# Patient Record
Sex: Male | Born: 1950 | Race: White | Hispanic: No | Marital: Married | Smoking: Never smoker
Health system: Southern US, Community
[De-identification: ages and names within clinical notes are randomized; demographics above are authoritative.]

## PROBLEM LIST (undated history)

## (undated) DIAGNOSIS — C61 Malignant neoplasm of prostate: Secondary | ICD-10-CM

## (undated) DIAGNOSIS — E669 Obesity, unspecified: Secondary | ICD-10-CM

## (undated) DIAGNOSIS — E559 Vitamin D deficiency, unspecified: Secondary | ICD-10-CM

## (undated) DIAGNOSIS — I82402 Acute embolism and thrombosis of unspecified deep veins of left lower extremity: Secondary | ICD-10-CM

## (undated) DIAGNOSIS — R7303 Prediabetes: Secondary | ICD-10-CM

## (undated) DIAGNOSIS — G4733 Obstructive sleep apnea (adult) (pediatric): Secondary | ICD-10-CM

## (undated) DIAGNOSIS — H9190 Unspecified hearing loss, unspecified ear: Secondary | ICD-10-CM

## (undated) DIAGNOSIS — M1990 Unspecified osteoarthritis, unspecified site: Secondary | ICD-10-CM

## (undated) DIAGNOSIS — D6859 Other primary thrombophilia: Secondary | ICD-10-CM

## (undated) DIAGNOSIS — J45909 Unspecified asthma, uncomplicated: Secondary | ICD-10-CM

## (undated) DIAGNOSIS — I10 Essential (primary) hypertension: Secondary | ICD-10-CM

## (undated) HISTORY — PX: VASECTOMY: SHX75

## (undated) HISTORY — PX: HERNIA REPAIR: SHX51

## (undated) HISTORY — PX: APPENDECTOMY: SHX54

## (undated) HISTORY — PX: CHOLECYSTECTOMY: SHX55

## (undated) HISTORY — PX: TONSILLECTOMY: SUR1361

## (undated) HISTORY — PX: OTHER SURGICAL HISTORY: SHX169

## (undated) HISTORY — PX: VEIN LIGATION AND STRIPPING: SHX2653

---
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
## 1972-01-24 DIAGNOSIS — I82402 Acute embolism and thrombosis of unspecified deep veins of left lower extremity: Secondary | ICD-10-CM

## 1972-01-24 HISTORY — DX: Acute embolism and thrombosis of unspecified deep veins of left lower extremity: I82.402

## 1999-11-20 ENCOUNTER — Emergency Department (HOSPITAL_COMMUNITY): Admission: EM | Admit: 1999-11-20 | Discharge: 1999-11-20 | Payer: Self-pay | Admitting: Emergency Medicine

## 1999-11-20 ENCOUNTER — Encounter: Payer: Self-pay | Admitting: Emergency Medicine

## 1999-11-29 ENCOUNTER — Encounter: Payer: Self-pay | Admitting: Specialist

## 1999-11-29 ENCOUNTER — Encounter: Admission: RE | Admit: 1999-11-29 | Discharge: 1999-11-29 | Payer: Self-pay | Admitting: Specialist

## 2019-12-24 ENCOUNTER — Encounter (HOSPITAL_COMMUNITY): Payer: Self-pay

## 2019-12-24 ENCOUNTER — Other Ambulatory Visit: Payer: Self-pay | Admitting: Orthopedic Surgery

## 2019-12-24 NOTE — Patient Instructions (Addendum)
DUE TO COVID-19 ONLY ONE VISITOR IS ALLOWED TO COME WITH YOU AND STAY IN THE WAITING ROOM ONLY DURING PRE OP AND PROCEDURE.   IF YOU WILL BE ADMITTED INTO THE HOSPITAL YOU ARE ALLOWED ONE SUPPORT PERSON DURING VISITATION HOURS ONLY (10AM -8PM)   . The support person may change daily. . The support person must pass our screening, gel in and out, and wear a mask at all times, including in the patient's room. . Patients must also wear a mask when staff or their support person are in the room.   COVID SWAB TESTING MUST BE COMPLETED ON: Today, Dec 25, 2019 at 12:45 PM     [address] [place], [place] [address]  (Must self quarantine after testing. Follow instructions on handout.)       Your procedure is scheduled on: Monday, Dec. 6, 2021   Report to [facility] Main  Entrance   Report to Short Stay at 5:30 AM   [facility])   Call this number if you have problems the morning of surgery [phone]   Do not eat food :After Midnight.   May have liquids until 4:30AM   day of surgery  CLEAR LIQUID DIET  Foods Allowed                                                                     Foods Excluded  Water, Black Coffee and tea, regular and decaf                             liquids that you cannot  Plain Jell-O in any flavor  (No red)                                           see through such as: Fruit ices (not with fruit pulp)                                     milk, soups, orange juice              Iced Popsicles (No red)                                    All solid food                                   Apple juices Sports drinks like Gatorade (No red) Lightly seasoned clear broth or consume(fat free) Sugar, honey syrup  Sample Menu Breakfast                                Lunch  Supper Cranberry juice                    Beef broth                            Chicken broth Jell-O                                     Grape juice                            Apple juice Coffee or tea                        Jell-O                                      Popsicle                                                Coffee or tea                        Coffee or tea      Complete one G2 drink the morning of surgery at  5:00AM     the day of surgery.   Oral Hygiene is also important to reduce your risk of infection.                                    Remember - BRUSH YOUR TEETH THE MORNING OF SURGERY WITH YOUR REGULAR TOOTHPASTE   Do NOT smoke after Midnight   Take these medicines the morning of surgery with A SIP OF WATER: Amlodipine  DO NOT TAKE ANY ORAL DIABETIC MEDICATIONS DAY OF YOUR SURGERY                               You may not have any metal on your body including  jewelry, and body piercings             Do not wear lotions, powders, perfumes/cologne, or deodorant                          Men may shave face and neck.   Do not bring valuables to the hospital. [place].   Contacts, dentures or bridgework may not be worn into surgery.   Bring small overnight bag day of surgery.    Special Instructions: Bring a copy of your healthcare power of attorney and living will documents         the day of surgery if you haven't scanned them in before.              Please read over the following fact sheets you were given: [facility] [place] [phone]   - Preparing for Surgery Before surgery, you can play an important role.  Because skin is not sterile, your skin needs to be as free of germs as possible.  You can reduce the number of germs on your skin by washing with CHG (chlorahexidine gluconate) soap before surgery.  CHG is an antiseptic cleaner which kills germs and bonds with the skin to continue killing germs even after washing. Please DO NOT use if you have an allergy to CHG or antibacterial soaps.  If your skin  becomes reddened/irritated stop using the CHG and inform your nurse when you arrive at Short Stay. Do not shave (including legs and underarms) for at least 48 hours prior to the first CHG shower.  You may shave your face/neck.  Please follow these instructions carefully:  1.  Shower with CHG Soap the night before surgery and the  morning of surgery.  2.  If you choose to wash your hair, wash your hair first as usual with your normal  shampoo.  3.  After you shampoo, rinse your hair and body thoroughly to remove the shampoo.                             4.  Use CHG as you would any other liquid soap.  You can apply chg directly to the skin and wash.  Gently with a scrungie or clean washcloth.  5.  Apply the CHG Soap to your body ONLY FROM THE NECK DOWN.   Do   not use on face/ open                           Wound or open sores. Avoid contact with eyes, ears mouth and   genitals (private parts).                       Wash face,  Genitals (private parts) with your normal soap.             6.  Wash thoroughly, paying special attention to the area where your    surgery  will be performed.  7.  Thoroughly rinse your body with warm water from the neck down.  8.  DO NOT shower/wash with your normal soap after using and rinsing off the CHG Soap.                9.  Pat yourself dry with a clean towel.            10.  Wear clean pajamas.            11.  Place clean sheets on your bed the night of your first shower and do not  sleep with pets. Day of Surgery : Do not apply any lotions/deodorants the morning of surgery.  Please wear clean clothes to the hospital/surgery center.  FAILURE TO FOLLOW THESE INSTRUCTIONS MAY RESULT IN THE CANCELLATION OF YOUR SURGERY  PATIENT SIGNATURE_________________________________  NURSE SIGNATURE__________________________________  ________________________________________________________________________   Adam Phenix  An incentive spirometer is a tool that  can help keep your lungs clear and active. This tool measures how well you are filling your lungs with each breath. Taking long deep breaths may help reverse or decrease the chance of developing breathing (pulmonary) problems (especially infection) following:  A long period of time when you are unable to move or be active. BEFORE THE PROCEDURE  If the spirometer includes an indicator to show your best effort, your nurse or respiratory therapist will set it to a desired goal.  If possible, sit up straight or lean slightly forward. Try not to slouch.  Hold the incentive spirometer in an upright position. INSTRUCTIONS FOR USE  1. Sit on the edge of your bed if possible, or sit up as far as you can in bed or on a chair. 2. Hold the incentive spirometer in an upright position. 3. Breathe out normally. 4. Place the mouthpiece in your mouth and seal your lips tightly around it. 5. Breathe in slowly and as deeply as possible, raising the piston or the ball toward the top of the column. 6. Hold your breath for 3-5 seconds or for as long as possible. Allow the piston or ball to fall to the bottom of the column. 7. Remove the mouthpiece from your mouth and breathe out normally. 8. Rest for a few seconds and repeat Steps 1 through 7 at least 10 times every 1-2 hours when you are awake. Take your time and take a few normal breaths between deep breaths. 9. The spirometer may include an indicator to show your best effort. Use the indicator as a goal to work toward during each repetition. 10. After each set of 10 deep breaths, practice coughing to be sure your lungs are clear. If you have an incision (the cut made at the time of surgery), support your incision when coughing by placing a pillow or rolled up towels firmly against it. Once you are able to get out of bed, walk around indoors and cough well. You may stop using the incentive spirometer when instructed by your caregiver.  RISKS AND  COMPLICATIONS  Take your time so you do not get dizzy or light-headed.  If you are in pain, you may need to take or ask for pain medication before doing incentive spirometry. It is harder to take a deep breath if you are having pain. AFTER USE  Rest and breathe slowly and easily.  It can be helpful to keep track of a log of your progress. Your caregiver can provide you with a simple table to help with this. If you are using the spirometer at home, follow these instructions: [place] IF:   You are having difficultly using the spirometer.  You have trouble using the spirometer as often as instructed.  Your pain medication is not giving enough relief while using the spirometer.  You develop fever of 100.5 F (38.1 C) or higher. SEEK IMMEDIATE MEDICAL CARE IF:   You cough up bloody sputum that had not been present before.  You develop fever of 102 F (38.9 C) or greater.  You develop worsening pain at or near the incision site. MAKE SURE YOU:   Understand these instructions.  Will watch your condition.  Will get help right away if you are not doing well or get worse. Document Released: 05/22/2006 Document Revised: 04/03/2011 Document Reviewed: 07/23/2006 [facility] Patient Information 2014 [place], [place].   ________________________________________________________________________

## 2019-12-24 NOTE — Progress Notes (Addendum)
COVID Vaccine Completed: N/A Date COVID Vaccine completed:N/A COVID vaccine manufacturer: N/A  PCP - Dr. Bernerd Limbo last office visit 12/16/19 Cardiologist - N/A  Chest x-ray - N/A EKG - 12/16/19 received and placed in chart Stress Test - N/A ECHO - N/A Cardiac Cath - N/A Pacemaker/ICD device last checked:N/A  Sleep Study - Yes CPAP - does not use sleeps in a recliner  Fasting Blood Sugar - N/A Checks Blood Sugar __N/A___ times a day Hgb A1c (6.7) 12/16/19 in care everywhere  Blood Thinner Instructions: Warfarin last dose 12/24/2019 Aspirin Instructions: Last Dose:  Activity level:  Unable to go up a flight of stairs without symptoms   Can go up a flight of stairs without stopping and without symptoms   Able to exercise without symptoms     Anesthesia review:  On Warfarin  Patient denies shortness of breath, fever, cough and chest pain at PAT appointment   Patient verbalized understanding of instructions that were given to them at the PAT appointment. Patient was also instructed that they will need to review over the PAT instructions again at home before surgery.

## 2019-12-25 ENCOUNTER — Other Ambulatory Visit (HOSPITAL_COMMUNITY)
Admission: RE | Admit: 2019-12-25 | Discharge: 2019-12-25 | Disposition: A | Discharge status: Home / Self Care | Payer: Medicare Other | Source: Ambulatory Visit | Attending: Orthopedic Surgery | Admitting: Orthopedic Surgery

## 2019-12-25 ENCOUNTER — Other Ambulatory Visit: Payer: Self-pay

## 2019-12-25 ENCOUNTER — Encounter (HOSPITAL_COMMUNITY)
Admission: RE | Admit: 2019-12-25 | Discharge: 2019-12-25 | Disposition: A | Discharge status: Home / Self Care | Payer: Medicare Other | Source: Ambulatory Visit | Attending: Orthopedic Surgery | Admitting: Orthopedic Surgery

## 2019-12-25 ENCOUNTER — Encounter (HOSPITAL_COMMUNITY): Payer: Self-pay

## 2019-12-25 DIAGNOSIS — Z01812 Encounter for preprocedural laboratory examination: Secondary | ICD-10-CM | POA: Insufficient documentation

## 2019-12-25 DIAGNOSIS — Z20822 Contact with and (suspected) exposure to covid-19: Secondary | ICD-10-CM | POA: Diagnosis not present

## 2019-12-25 HISTORY — DX: Vitamin D deficiency, unspecified: E55.9

## 2019-12-25 HISTORY — DX: Malignant neoplasm of prostate: C61

## 2019-12-25 HISTORY — DX: Obstructive sleep apnea (adult) (pediatric): G47.33

## 2019-12-25 HISTORY — DX: Obesity, unspecified: E66.9

## 2019-12-25 HISTORY — DX: Other primary thrombophilia: D68.59

## 2019-12-25 HISTORY — DX: Prediabetes: R73.03

## 2019-12-25 HISTORY — DX: Essential (primary) hypertension: I10

## 2019-12-25 HISTORY — DX: Unspecified hearing loss, unspecified ear: H91.90

## 2019-12-25 HISTORY — DX: Unspecified osteoarthritis, unspecified site: M19.90

## 2019-12-25 HISTORY — DX: Unspecified asthma, uncomplicated: J45.909

## 2019-12-25 LAB — SARS CORONAVIRUS 2 (TAT 6-24 HRS): SARS Coronavirus 2: NEGATIVE

## 2019-12-25 LAB — SURGICAL PCR SCREEN
MRSA, PCR: NEGATIVE
Staphylococcus aureus: NEGATIVE

## 2019-12-28 MED ORDER — BUPIVACAINE LIPOSOME 1.3 % IJ SUSP
20.0000 mL | Freq: Once | INTRAMUSCULAR | Status: DC
  Filled 2019-12-28: qty 20

## 2019-12-28 NOTE — Anesthesia Preprocedure Evaluation (Addendum)
Anesthesia Evaluation  Patient identified by MRN, date of birth, ID band Patient awake    Reviewed: Allergy & Precautions, NPO status , Patient's Chart, lab work & pertinent test results  Airway Mallampati: II  TM Distance: >3 FB Neck ROM: Full    Dental   Pulmonary asthma , sleep apnea ,    breath sounds clear to auscultation       Cardiovascular hypertension, Pt. 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 last dose 12/1), ,   Anesthesia Other Findings   Reproductive/Obstetrics                            Lab Results  Component Value Date   WBC 5.3 12/29/2019   HGB 13.9 12/29/2019   HCT 42.1 12/29/2019   MCV 91.1 12/29/2019   PLT 224 12/29/2019   Lab Results  Component Value Date   CREATININE 1.13 12/29/2019   BUN 22 12/29/2019   NA 136 12/29/2019   K 3.2 (L) 12/29/2019   CL 101 12/29/2019   CO2 23 12/29/2019    Anesthesia Physical Anesthesia Plan  ASA: III  Anesthesia Plan: Spinal   Post-op Pain Management:  Regional for Post-op pain   Induction:   PONV Risk Score and Plan: 1 and Propofol infusion, Ondansetron and Treatment may vary due to age or medical condition  Airway Management Planned: Natural Airway and Simple Face Mask  Additional Equipment:   Intra-op Plan:   Post-operative Plan:   Informed Consent: I have reviewed the patients History and Physical, chart, labs and discussed the procedure including the risks, benefits and alternatives for the proposed anesthesia with the patient or authorized representative who has indicated his/her understanding and acceptance.        Plan Discussed with: CRNA  Anesthesia Plan Comments:        Anesthesia Quick Evaluation

## 2019-12-29 ENCOUNTER — Other Ambulatory Visit: Payer: Self-pay

## 2019-12-29 ENCOUNTER — Ambulatory Visit (HOSPITAL_COMMUNITY): Payer: Medicare Other | Admitting: Physician Assistant

## 2019-12-29 ENCOUNTER — Observation Stay (HOSPITAL_COMMUNITY)
Admission: RE | Admit: 2019-12-29 | Discharge: 2019-12-30 | Disposition: A | Discharge status: Home / Self Care | Payer: Medicare Other | Attending: Orthopedic Surgery | Admitting: Orthopedic Surgery

## 2019-12-29 ENCOUNTER — Encounter (HOSPITAL_COMMUNITY)
Admission: RE | Disposition: A | Discharge status: Home / Self Care | Payer: Self-pay | Source: Home / Self Care | Attending: Orthopedic Surgery

## 2019-12-29 ENCOUNTER — Encounter (HOSPITAL_COMMUNITY): Payer: Self-pay | Admitting: Orthopedic Surgery

## 2019-12-29 DIAGNOSIS — Z96659 Presence of unspecified artificial knee joint: Secondary | ICD-10-CM

## 2019-12-29 DIAGNOSIS — J45909 Unspecified asthma, uncomplicated: Secondary | ICD-10-CM | POA: Insufficient documentation

## 2019-12-29 DIAGNOSIS — Z79899 Other long term (current) drug therapy: Secondary | ICD-10-CM | POA: Insufficient documentation

## 2019-12-29 DIAGNOSIS — M25562 Pain in left knee: Secondary | ICD-10-CM | POA: Diagnosis present

## 2019-12-29 DIAGNOSIS — M1711 Unilateral primary osteoarthritis, right knee: Principal | ICD-10-CM | POA: Insufficient documentation

## 2019-12-29 DIAGNOSIS — Z7901 Long term (current) use of anticoagulants: Secondary | ICD-10-CM | POA: Insufficient documentation

## 2019-12-29 DIAGNOSIS — Z8546 Personal history of malignant neoplasm of prostate: Secondary | ICD-10-CM | POA: Insufficient documentation

## 2019-12-29 DIAGNOSIS — I10 Essential (primary) hypertension: Secondary | ICD-10-CM | POA: Diagnosis not present

## 2019-12-29 HISTORY — PX: TOTAL KNEE ARTHROPLASTY: SHX125

## 2019-12-29 LAB — CBC WITH DIFFERENTIAL/PLATELET
Abs Immature Granulocytes: 0.05 10*3/uL (ref 0.00–0.07)
Basophils Absolute: 0 10*3/uL (ref 0.0–0.1)
Basophils Relative: 1 %
Eosinophils Absolute: 0.2 10*3/uL (ref 0.0–0.5)
Eosinophils Relative: 3 %
HCT: 42.1 % (ref 39.0–52.0)
Hemoglobin: 13.9 g/dL (ref 13.0–17.0)
Immature Granulocytes: 1 %
Lymphocytes Relative: 20 %
Lymphs Abs: 1 10*3/uL (ref 0.7–4.0)
MCH: 30.1 pg (ref 26.0–34.0)
MCHC: 33 g/dL (ref 30.0–36.0)
MCV: 91.1 fL (ref 80.0–100.0)
Monocytes Absolute: 0.7 10*3/uL (ref 0.1–1.0)
Monocytes Relative: 13 %
Neutro Abs: 3.3 10*3/uL (ref 1.7–7.7)
Neutrophils Relative %: 62 %
Platelets: 224 10*3/uL (ref 150–400)
RBC: 4.62 MIL/uL (ref 4.22–5.81)
RDW: 13.4 % (ref 11.5–15.5)
WBC: 5.3 10*3/uL (ref 4.0–10.5)
nRBC: 0 % (ref 0.0–0.2)

## 2019-12-29 LAB — COMPREHENSIVE METABOLIC PANEL
ALT: 44 U/L (ref 0–44)
AST: 29 U/L (ref 15–41)
Albumin: 4.2 g/dL (ref 3.5–5.0)
Alkaline Phosphatase: 47 U/L (ref 38–126)
Anion gap: 12 (ref 5–15)
BUN: 22 mg/dL (ref 8–23)
CO2: 23 mmol/L (ref 22–32)
Calcium: 9.2 mg/dL (ref 8.9–10.3)
Chloride: 101 mmol/L (ref 98–111)
Creatinine, Ser: 1.13 mg/dL (ref 0.61–1.24)
GFR, Estimated: >60 mL/min (ref >60)
Glucose, Bld: 130 mg/dL — ABNORMAL HIGH (ref 70–99)
Potassium: 3.2 mmol/L — ABNORMAL LOW (ref 3.5–5.1)
Sodium: 136 mmol/L (ref 135–145)
Total Bilirubin: 0.6 mg/dL (ref 0.3–1.2)
Total Protein: 7.2 g/dL (ref 6.5–8.1)

## 2019-12-29 LAB — PROTIME-INR
INR: 1.2 (ref 0.8–1.2)
Prothrombin Time: 14.9 seconds (ref 11.4–15.2)

## 2019-12-29 SURGERY — ARTHROPLASTY, KNEE, TOTAL
Anesthesia: Spinal | Site: Knee | Laterality: Left

## 2019-12-29 MED ORDER — MENTHOL 3 MG MT LOZG: 1.0000 | LOZENGE | OROMUCOSAL | Status: DC | PRN

## 2019-12-29 MED ORDER — AMISULPRIDE (ANTIEMETIC) 5 MG/2ML IV SOLN: 10.0000 mg | Freq: Once | INTRAVENOUS | Status: DC | PRN

## 2019-12-29 MED ORDER — MIDAZOLAM HCL 2 MG/2ML IJ SOLN
1.0000 mg | INTRAMUSCULAR | Status: DC
  Administered 2019-12-29: 1 mg via INTRAVENOUS
  Filled 2019-12-29: qty 2

## 2019-12-29 MED ORDER — ZOLPIDEM TARTRATE 5 MG PO TABS: 5.0000 mg | ORAL_TABLET | Freq: Every evening | ORAL | Status: DC | PRN

## 2019-12-29 MED ORDER — METHOCARBAMOL 500 MG IVPB - SIMPLE MED
500.0000 mg | Freq: Four times a day (QID) | INTRAVENOUS | Status: DC | PRN
  Filled 2019-12-29: qty 50

## 2019-12-29 MED ORDER — PHENYLEPHRINE HCL (PRESSORS) 10 MG/ML IV SOLN
INTRAVENOUS | Status: AC
  Filled 2019-12-29: qty 1

## 2019-12-29 MED ORDER — DEXAMETHASONE SODIUM PHOSPHATE 10 MG/ML IJ SOLN
8.0000 mg | Freq: Once | INTRAMUSCULAR | Status: AC
  Administered 2019-12-29: 8 mg via INTRAVENOUS

## 2019-12-29 MED ORDER — WATER FOR IRRIGATION, STERILE IR SOLN
Status: DC | PRN
  Administered 2019-12-29: 2000 mL

## 2019-12-29 MED ORDER — MIDAZOLAM HCL 2 MG/2ML IJ SOLN
INTRAMUSCULAR | Status: DC | PRN
  Administered 2019-12-29: 1 mg via INTRAVENOUS
  Administered 2019-12-29: .5 mg via INTRAVENOUS

## 2019-12-29 MED ORDER — CEFAZOLIN SODIUM-DEXTROSE 2-4 GM/100ML-% IV SOLN
2.0000 g | INTRAVENOUS | Status: AC
  Administered 2019-12-29: 2 g via INTRAVENOUS
  Filled 2019-12-29: qty 100

## 2019-12-29 MED ORDER — METOCLOPRAMIDE HCL 5 MG PO TABS: 5.0000 mg | ORAL_TABLET | Freq: Three times a day (TID) | ORAL | Status: DC | PRN

## 2019-12-29 MED ORDER — 0.9 % SODIUM CHLORIDE (POUR BTL) OPTIME
TOPICAL | Status: DC | PRN
  Administered 2019-12-29: 1000 mL

## 2019-12-29 MED ORDER — EPHEDRINE SULFATE-NACL 50-0.9 MG/10ML-% IV SOSY
PREFILLED_SYRINGE | INTRAVENOUS | Status: DC | PRN
  Administered 2019-12-29 (×4): 5 mg via INTRAVENOUS

## 2019-12-29 MED ORDER — ONDANSETRON HCL 4 MG/2ML IJ SOLN
INTRAMUSCULAR | Status: AC
  Filled 2019-12-29: qty 2

## 2019-12-29 MED ORDER — SODIUM CHLORIDE 0.9 % IR SOLN
Status: DC | PRN
  Administered 2019-12-29: 1000 mL

## 2019-12-29 MED ORDER — METOCLOPRAMIDE HCL 5 MG/ML IJ SOLN: 5.0000 mg | Freq: Three times a day (TID) | INTRAMUSCULAR | Status: DC | PRN

## 2019-12-29 MED ORDER — ACETAMINOPHEN 500 MG PO TABS
1000.0000 mg | ORAL_TABLET | Freq: Four times a day (QID) | ORAL | Status: AC
  Administered 2019-12-29 – 2019-12-30 (×4): 1000 mg via ORAL
  Filled 2019-12-29 (×4): qty 2

## 2019-12-29 MED ORDER — PROPOFOL 1000 MG/100ML IV EMUL
INTRAVENOUS | Status: AC
  Filled 2019-12-29: qty 100

## 2019-12-29 MED ORDER — ALUM & MAG HYDROXIDE-SIMETH 200-200-20 MG/5ML PO SUSP: 30.0000 mL | ORAL | Status: DC | PRN

## 2019-12-29 MED ORDER — DEXAMETHASONE SODIUM PHOSPHATE 10 MG/ML IJ SOLN
INTRAMUSCULAR | Status: AC
  Filled 2019-12-29: qty 1

## 2019-12-29 MED ORDER — SODIUM CHLORIDE 0.9 % IV SOLN: INTRAVENOUS | Status: DC

## 2019-12-29 MED ORDER — LISINOPRIL 20 MG PO TABS
20.0000 mg | ORAL_TABLET | Freq: Every day | ORAL | Status: DC
  Administered 2019-12-30: 20 mg via ORAL
  Filled 2019-12-29: qty 1

## 2019-12-29 MED ORDER — BUPIVACAINE IN DEXTROSE 0.75-8.25 % IT SOLN
INTRATHECAL | Status: DC | PRN
  Administered 2019-12-29: 1.5 mL via INTRATHECAL

## 2019-12-29 MED ORDER — LISINOPRIL-HYDROCHLOROTHIAZIDE 20-25 MG PO TABS: 1.0000 | ORAL_TABLET | ORAL | Status: DC

## 2019-12-29 MED ORDER — BUPIVACAINE-EPINEPHRINE 0.25% -1:200000 IJ SOLN
INTRAMUSCULAR | Status: DC | PRN
  Administered 2019-12-29: 30 mL

## 2019-12-29 MED ORDER — HYDROCHLOROTHIAZIDE 25 MG PO TABS
25.0000 mg | ORAL_TABLET | Freq: Every day | ORAL | Status: DC
  Administered 2019-12-30: 25 mg via ORAL
  Filled 2019-12-29: qty 1

## 2019-12-29 MED ORDER — SENNOSIDES-DOCUSATE SODIUM 8.6-50 MG PO TABS: 1.0000 | ORAL_TABLET | Freq: Every evening | ORAL | Status: DC | PRN

## 2019-12-29 MED ORDER — FENTANYL CITRATE (PF) 100 MCG/2ML IJ SOLN
50.0000 ug | INTRAMUSCULAR | Status: DC
  Administered 2019-12-29: 50 ug via INTRAVENOUS
  Filled 2019-12-29: qty 2

## 2019-12-29 MED ORDER — GABAPENTIN 300 MG PO CAPS
300.0000 mg | ORAL_CAPSULE | Freq: Once | ORAL | Status: AC
  Administered 2019-12-29: 300 mg via ORAL
  Filled 2019-12-29: qty 1

## 2019-12-29 MED ORDER — ONDANSETRON HCL 4 MG/2ML IJ SOLN
INTRAMUSCULAR | Status: DC | PRN
  Administered 2019-12-29: 4 mg via INTRAVENOUS

## 2019-12-29 MED ORDER — LACTATED RINGERS IV SOLN: INTRAVENOUS | Status: DC

## 2019-12-29 MED ORDER — FENTANYL CITRATE (PF) 100 MCG/2ML IJ SOLN
25.0000 ug | INTRAMUSCULAR | Status: DC | PRN
  Administered 2019-12-29 (×3): 50 ug via INTRAVENOUS

## 2019-12-29 MED ORDER — AMLODIPINE BESYLATE 10 MG PO TABS
10.0000 mg | ORAL_TABLET | Freq: Every day | ORAL | Status: DC
  Administered 2019-12-30: 10 mg via ORAL
  Filled 2019-12-29: qty 1

## 2019-12-29 MED ORDER — WARFARIN - PHARMACIST DOSING INPATIENT: Freq: Every day | Status: DC

## 2019-12-29 MED ORDER — ONDANSETRON HCL 4 MG PO TABS: 4.0000 mg | ORAL_TABLET | Freq: Four times a day (QID) | ORAL | Status: DC | PRN

## 2019-12-29 MED ORDER — CHLORHEXIDINE GLUCONATE 0.12 % MT SOLN
15.0000 mL | Freq: Once | OROMUCOSAL | Status: AC
  Administered 2019-12-29: 15 mL via OROMUCOSAL

## 2019-12-29 MED ORDER — TRANEXAMIC ACID-NACL 1000-0.7 MG/100ML-% IV SOLN
1000.0000 mg | INTRAVENOUS | Status: AC
  Administered 2019-12-29: 1000 mg via INTRAVENOUS
  Filled 2019-12-29: qty 100

## 2019-12-29 MED ORDER — POVIDONE-IODINE 10 % EX SWAB
2.0000 "application " | Freq: Once | CUTANEOUS | Status: AC
  Administered 2019-12-29: 2 via TOPICAL

## 2019-12-29 MED ORDER — FERROUS SULFATE 325 (65 FE) MG PO TABS
325.0000 mg | ORAL_TABLET | Freq: Three times a day (TID) | ORAL | Status: DC
  Administered 2019-12-30 (×2): 325 mg via ORAL
  Filled 2019-12-29 (×2): qty 1

## 2019-12-29 MED ORDER — ROPIVACAINE HCL 5 MG/ML IJ SOLN
INTRAMUSCULAR | Status: DC | PRN
  Administered 2019-12-29: 25 mL via PERINEURAL

## 2019-12-29 MED ORDER — PANTOPRAZOLE SODIUM 40 MG PO TBEC
40.0000 mg | DELAYED_RELEASE_TABLET | Freq: Every day | ORAL | Status: DC
  Administered 2019-12-30: 40 mg via ORAL
  Filled 2019-12-29: qty 1

## 2019-12-29 MED ORDER — FLEET ENEMA 7-19 GM/118ML RE ENEM: 1.0000 | ENEMA | Freq: Once | RECTAL | Status: DC | PRN

## 2019-12-29 MED ORDER — DIPHENHYDRAMINE HCL 12.5 MG/5ML PO ELIX: 12.5000 mg | ORAL_SOLUTION | ORAL | Status: DC | PRN

## 2019-12-29 MED ORDER — MIDAZOLAM HCL 2 MG/2ML IJ SOLN
INTRAMUSCULAR | Status: AC
  Filled 2019-12-29: qty 2

## 2019-12-29 MED ORDER — WARFARIN SODIUM 5 MG PO TABS
7.5000 mg | ORAL_TABLET | Freq: Once | ORAL | Status: AC
  Administered 2019-12-29: 7.5 mg via ORAL
  Filled 2019-12-29: qty 1

## 2019-12-29 MED ORDER — PROPOFOL 500 MG/50ML IV EMUL
INTRAVENOUS | Status: DC | PRN
  Administered 2019-12-29: 50 ug/kg/min via INTRAVENOUS

## 2019-12-29 MED ORDER — DEXAMETHASONE SODIUM PHOSPHATE 10 MG/ML IJ SOLN
10.0000 mg | Freq: Once | INTRAMUSCULAR | Status: AC
  Administered 2019-12-30: 10 mg via INTRAVENOUS
  Filled 2019-12-29: qty 1

## 2019-12-29 MED ORDER — BISACODYL 5 MG PO TBEC: 5.0000 mg | DELAYED_RELEASE_TABLET | Freq: Every day | ORAL | Status: DC | PRN

## 2019-12-29 MED ORDER — SODIUM CHLORIDE (PF) 0.9 % IJ SOLN
INTRAMUSCULAR | Status: AC
  Filled 2019-12-29: qty 20

## 2019-12-29 MED ORDER — DOCUSATE SODIUM 100 MG PO CAPS
100.0000 mg | ORAL_CAPSULE | Freq: Two times a day (BID) | ORAL | Status: DC
  Administered 2019-12-29 – 2019-12-30 (×2): 100 mg via ORAL
  Filled 2019-12-29 (×2): qty 1

## 2019-12-29 MED ORDER — HYDROMORPHONE HCL 1 MG/ML IJ SOLN: 0.5000 mg | INTRAMUSCULAR | Status: DC | PRN

## 2019-12-29 MED ORDER — FENTANYL CITRATE (PF) 100 MCG/2ML IJ SOLN
INTRAMUSCULAR | Status: AC
  Filled 2019-12-29: qty 2

## 2019-12-29 MED ORDER — TRANEXAMIC ACID-NACL 1000-0.7 MG/100ML-% IV SOLN
1000.0000 mg | Freq: Once | INTRAVENOUS | Status: AC
  Administered 2019-12-29: 1000 mg via INTRAVENOUS
  Filled 2019-12-29: qty 100

## 2019-12-29 MED ORDER — GABAPENTIN 300 MG PO CAPS
300.0000 mg | ORAL_CAPSULE | Freq: Three times a day (TID) | ORAL | Status: DC
  Administered 2019-12-29 – 2019-12-30 (×3): 300 mg via ORAL
  Filled 2019-12-29 (×3): qty 1

## 2019-12-29 MED ORDER — ONDANSETRON HCL 4 MG/2ML IJ SOLN: 4.0000 mg | Freq: Four times a day (QID) | INTRAMUSCULAR | Status: DC | PRN

## 2019-12-29 MED ORDER — CLONIDINE HCL (ANALGESIA) 100 MCG/ML EP SOLN
EPIDURAL | Status: DC | PRN
  Administered 2019-12-29: 50 ug

## 2019-12-29 MED ORDER — METHOCARBAMOL 500 MG PO TABS
500.0000 mg | ORAL_TABLET | Freq: Four times a day (QID) | ORAL | Status: DC | PRN
  Administered 2019-12-30: 500 mg via ORAL
  Filled 2019-12-29: qty 1

## 2019-12-29 MED ORDER — PROPOFOL 10 MG/ML IV BOLUS
INTRAVENOUS | Status: DC | PRN
  Administered 2019-12-29 (×2): 10 mg via INTRAVENOUS

## 2019-12-29 MED ORDER — CELECOXIB 200 MG PO CAPS
400.0000 mg | ORAL_CAPSULE | Freq: Once | ORAL | Status: AC
  Administered 2019-12-29: 400 mg via ORAL
  Filled 2019-12-29: qty 2

## 2019-12-29 MED ORDER — ACETAMINOPHEN 500 MG PO TABS
1000.0000 mg | ORAL_TABLET | Freq: Once | ORAL | Status: AC
  Administered 2019-12-29: 1000 mg via ORAL
  Filled 2019-12-29: qty 2

## 2019-12-29 MED ORDER — BUPIVACAINE-EPINEPHRINE (PF) 0.25% -1:200000 IJ SOLN
INTRAMUSCULAR | Status: AC
  Filled 2019-12-29: qty 30

## 2019-12-29 MED ORDER — PHENOL 1.4 % MT LIQD: 1.0000 | OROMUCOSAL | Status: DC | PRN

## 2019-12-29 MED ORDER — OXYCODONE HCL 5 MG PO TABS
5.0000 mg | ORAL_TABLET | ORAL | Status: DC | PRN
  Administered 2019-12-30 (×2): 5 mg via ORAL
  Filled 2019-12-29 (×2): qty 1

## 2019-12-29 MED ORDER — SODIUM CHLORIDE 0.9% FLUSH
INTRAVENOUS | Status: DC | PRN
  Administered 2019-12-29: 20 mL via INTRADERMAL

## 2019-12-29 MED ORDER — BUPIVACAINE LIPOSOME 1.3 % IJ SUSP
INTRAMUSCULAR | Status: DC | PRN
  Administered 2019-12-29: 20 mL

## 2019-12-29 MED ORDER — ORAL CARE MOUTH RINSE: 15.0000 mL | Freq: Once | OROMUCOSAL | Status: AC

## 2019-12-29 MED ORDER — PROPOFOL 10 MG/ML IV BOLUS
INTRAVENOUS | Status: AC
  Filled 2019-12-29: qty 40

## 2019-12-29 MED ORDER — CEFAZOLIN SODIUM-DEXTROSE 2-4 GM/100ML-% IV SOLN
2.0000 g | Freq: Four times a day (QID) | INTRAVENOUS | Status: AC
  Administered 2019-12-29 (×2): 2 g via INTRAVENOUS
  Filled 2019-12-29 (×2): qty 100

## 2019-12-29 MED ORDER — ENOXAPARIN SODIUM 40 MG/0.4ML ~~LOC~~ SOLN
40.0000 mg | SUBCUTANEOUS | Status: DC
  Administered 2019-12-30: 40 mg via SUBCUTANEOUS
  Filled 2019-12-29: qty 0.4

## 2019-12-29 SURGICAL SUPPLY — 58 items
ARTISURF 10M PLYL 10-11EF KNEE (Knees) ×1 IMPLANT
BAG SPEC THK2 15X12 ZIP CLS (MISCELLANEOUS) ×1
BAG ZIPLOCK 12X15 (MISCELLANEOUS) ×2 IMPLANT
BLADE SAGITTAL 13X1.27X60 (BLADE) ×2 IMPLANT
BLADE SAW SGTL 83.5X18.5 (BLADE) ×2 IMPLANT
BLADE SURG 15 STRL LF DISP TIS (BLADE) ×1 IMPLANT
BLADE SURG 15 STRL SS (BLADE) ×2
BLADE SURG SZ10 CARB STEEL (BLADE) ×4 IMPLANT
BNDG ELASTIC 6X5.8 VLCR STR LF (GAUZE/BANDAGES/DRESSINGS) ×2 IMPLANT
BOWL SMART MIX CTS (DISPOSABLE) ×2 IMPLANT
BSPLAT TIB 5D E CMNT STM LT (Knees) ×1 IMPLANT
CEMENT BONE SIMPLEX SPEEDSET (Cement) ×4 IMPLANT
COVER SURGICAL LIGHT HANDLE (MISCELLANEOUS) ×2 IMPLANT
COVER WAND RF STERILE (DRAPES) IMPLANT
CUFF TOURN SGL QUICK 34 (TOURNIQUET CUFF) ×2
CUFF TRNQT CYL 34X4.125X (TOURNIQUET CUFF) ×1 IMPLANT
DECANTER SPIKE VIAL GLASS SM (MISCELLANEOUS) ×4 IMPLANT
DRAPE INCISE IOBAN 66X45 STRL (DRAPES) ×4 IMPLANT
DRAPE U-SHAPE 47X51 STRL (DRAPES) ×2 IMPLANT
DRSG AQUACEL AG ADV 3.5X10 (GAUZE/BANDAGES/DRESSINGS) ×2 IMPLANT
DURAPREP 26ML APPLICATOR (WOUND CARE) ×4 IMPLANT
ELECT REM PT RETURN 15FT ADLT (MISCELLANEOUS) ×2 IMPLANT
FEMUR  CMT CCR STD SZ10 L KNEE (Knees) ×2 IMPLANT
FEMUR CMT CCR STD SZ10 L KNEE (Knees) ×1 IMPLANT
FEMUR CMTD CR PERS STD SZ 5 RT (Knees) IMPLANT
GLOVE BIOGEL M STRL SZ7.5 (GLOVE) ×2 IMPLANT
GLOVE BIOGEL PI IND STRL 7.5 (GLOVE) ×1 IMPLANT
GLOVE BIOGEL PI IND STRL 8.5 (GLOVE) ×2 IMPLANT
GLOVE BIOGEL PI INDICATOR 7.5 (GLOVE) ×1
GLOVE BIOGEL PI INDICATOR 8.5 (GLOVE) ×2
GLOVE SURG ORTHO 8.0 STRL STRW (GLOVE) ×6 IMPLANT
GOWN STRL REUS W/ TWL XL LVL3 (GOWN DISPOSABLE) ×2 IMPLANT
GOWN STRL REUS W/TWL XL LVL3 (GOWN DISPOSABLE) ×4
HANDPIECE INTERPULSE COAX TIP (DISPOSABLE) ×2
HOLDER FOLEY CATH W/STRAP (MISCELLANEOUS) ×2 IMPLANT
HOOD PEEL AWAY FLYTE STAYCOOL (MISCELLANEOUS) ×6 IMPLANT
KIT TURNOVER KIT A (KITS) IMPLANT
MANIFOLD NEPTUNE II (INSTRUMENTS) ×2 IMPLANT
NEEDLE HYPO 22GX1.5 SAFETY (NEEDLE) ×2 IMPLANT
NS IRRIG 1000ML POUR BTL (IV SOLUTION) ×2 IMPLANT
PACK TOTAL KNEE CUSTOM (KITS) ×2 IMPLANT
PENCIL SMOKE EVACUATOR (MISCELLANEOUS) ×2 IMPLANT
PROTECTOR NERVE ULNAR (MISCELLANEOUS) ×2 IMPLANT
SET HNDPC FAN SPRY TIP SCT (DISPOSABLE) ×1 IMPLANT
STEM POLY PAT PLY 32M KNEE (Knees) ×1 IMPLANT
STEM TIBIA 5 DEG SZ E L KNEE (Knees) IMPLANT
STRIP CLOSURE SKIN 1/2X4 (GAUZE/BANDAGES/DRESSINGS) ×3 IMPLANT
SUT BONE WAX W31G (SUTURE) ×2 IMPLANT
SUT MNCRL AB 3-0 PS2 18 (SUTURE) ×2 IMPLANT
SUT STRATAFIX 0 PDS 27 VIOLET (SUTURE) ×2
SUT STRATAFIX PDS+ 0 24IN (SUTURE) ×2 IMPLANT
SUT VIC AB 1 CT1 36 (SUTURE) ×2 IMPLANT
SUTURE STRATFX 0 PDS 27 VIOLET (SUTURE) ×1 IMPLANT
SYR 30ML LL (SYRINGE) ×4 IMPLANT
TIBIA STEM 5 DEG SZ E L KNEE (Knees) ×2 IMPLANT
TRAY FOLEY MTR SLVR 16FR STAT (SET/KITS/TRAYS/PACK) ×2 IMPLANT
WATER STERILE IRR 1000ML POUR (IV SOLUTION) ×4 IMPLANT
WRAP KNEE MAXI GEL POST OP (GAUZE/BANDAGES/DRESSINGS) ×2 IMPLANT

## 2019-12-29 NOTE — Progress Notes (Signed)
Assisted Dr. Rob Fitzgerald with left, ultrasound guided, adductor canal block. Side rails up, monitors on throughout procedure. See vital signs in flow sheet. Tolerated Procedure well.  

## 2019-12-29 NOTE — Progress Notes (Signed)
Orthopedic Tech Progress Note Patient Details:  GARAN FRAPPIER 10-10-1950 [id number]  CPM Left Knee CPM Left Knee: On Left Knee Flexion (Degrees): 90 Left Knee Extension (Degrees): 0  Post Interventions Patient Tolerated: Well Instructions Provided: Care of device Ortho Devices Type of Ortho Device: Bone foam zero knee Ortho Device/Splint Interventions: Ordered, Application, Adjustment   Post Interventions Patient Tolerated: Well Instructions Provided: Care of device   Braulio Bosch 12/29/2019, 10:48 AM

## 2019-12-29 NOTE — Op Note (Signed)
TOTAL KNEE REPLACEMENT OPERATIVE NOTE:  12/29/2019  9:13 AM  PATIENT:  Arthur King  69 y.o. male  PRE-OPERATIVE DIAGNOSIS:  Osteoarthritis of left knee M17.11  POST-OPERATIVE DIAGNOSIS:  Osteoarthritis of left knee M17.11  PROCEDURE:  Procedure(s): TOTAL KNEE ARTHROPLASTY  SURGEON:  Surgeon(s): Vickey Huger, MD  PHYSICIAN ASSISTANT: Nehemiah Massed, PA-C  ANESTHESIA:   spinal  SPECIMEN: None  COUNTS:  Correct  TOURNIQUET:  * Missing tourniquet times found for documented tourniquets in log: [id number] *  DICTATION:  Indication for procedure:    The patient is a 69 y.o. male who has failed conservative treatment for Osteoarthritis of left knee M17.11.  Informed consent was obtained prior to anesthesia. The risks versus benefits of the operation were explain and in a way the patient can, and did, understand.    Description of procedure:     The patient was taken to the operating room and placed under anesthesia.  The patient was positioned in the usual fashion taking care that all body parts were adequately padded and/or protected.  A tourniquet was applied and the leg prepped and draped in the usual sterile fashion.  The extremity was exsanguinated with the esmarch and tourniquet inflated to 300 mmHg.  Pre-operative range of motion was normal.   A midline incision approximately 6-7 inches long was made with a #10 blade.  A new blade was used to make a parapatellar arthrotomy going 2-3 cm into the quadriceps tendon, over the patella, and alongside the medial aspect of the patellar tendon.  A synovectomy was then performed with the #10 blade and forceps. I then elevated the deep MCL off the medial tibial metaphysis subperiosteally around to the semimembranosus attachment.    I everted the patella and used calipers to measure patellar thickness.  I used the reamer to ream down to appropriate thickness to recreate the native thickness.  I then removed excess bone with the rongeur and  sagittal saw.  I used the appropriately sized template and drilled the three lug holes.  I then put the trial in place and measured the thickness with the calipers to ensure recreation of the native thickness.  The trial was then removed and the patella subluxed and the knee brought into flexion.  A homan retractor was place to retract and protect the patella and lateral structures.  A Z-retractor was place medially to protect the medial structures.  The extra-medullary alignment system was used to make cut the tibial articular surface perpendicular to the anamotic axis of the tibia and in 3 degrees of posterior slope.  The cut surface and alignment jig was removed.  I then used the intramedullary alignment guide to make a valgus cut on the distal femur.  I then marked out the epicondylar axis on the distal femur. I then used the anterior referencing sizer and measured the femur to be a size 10.  The 4-In-1 cutting block was screwed into place in external rotation matching the posterior condylar angle, making our cuts perpendicular to the epicondylar axis.  Anterior, posterior and chamfer cuts were made with the sagittal saw.  The cutting block and cut pieces were removed.  A lamina spreader was placed in 90 degrees of flexion.  The ACL, PCL, menisci, and posterior condylar osteophytes were removed.  A 10 mm spacer blocked was found to offer good flexion and extension gap balance after minimal in degree releasing.   The scoop retractor was then placed and the femoral finishing block was pinned in  place.  The small sagittal saw was used as well as the lug drill to finish the femur.  The block and cut surfaces were removed and the medullary canal hole filled with autograft bone from the cut pieces.  The tibia was delivered forward in deep flexion and external rotation.  A size E tray was selected and pinned into place centered on the medial 1/3 of the tibial tubercle.  The reamer and keel was used to prepare  the tibia through the tray.    I then trialed with the size 10 femur, size E tibia, a 10 mm insert and the 32 patella.  I had excellent flexion/extension gap balance, excellent patella tracking.  Flexion was full and beyond 120 degrees; extension was zero.  These components were chosen and the staff opened them to me on the back table while the knee was lavaged copiously and the cement mixed.  The soft tissue was infiltrated with 60cc of exparel 1.3% through a 21 gauge needle.  I cemented in the components and removed all excess cement.  The polyethylene tibial component was snapped into place and the knee placed in extension while cement was hardening.  The capsule was infilltrated with a 60cc exparel/marcaine/saline mixture.   Once the cement was hard, the tourniquet was let down.  Hemostasis was obtained.  The arthrotomy was closed using a #1 stratofix running suture.  The deep soft tissues were closed with #0 vicryls and the subcuticular layer closed with #2-0 vicryl.  The skin was reapproximated and closed with 3.0 Monocryl.  The wound was covered with steristrips, aquacel dressing, and a TED stocking.   The patient was then awakened, extubated, and taken to the recovery room in stable condition.  BLOOD LOSS:  263ZC COMPLICATIONS:  None.  PLAN OF CARE: Admit for overnight observation  PATIENT DISPOSITION:  PACU - hemodynamically stable.    Please fax a copy of this op note to my office at [phone] (please only include page 1 and 2 of the Case Information op note)

## 2019-12-29 NOTE — Anesthesia Procedure Notes (Signed)
Spinal  Patient location during procedure: OR Start time: 12/29/2019 8:07 AM End time: 12/29/2019 8:12 AM Staffing Performed: anesthesiologist  Anesthesiologist: Suzette Battiest, MD Preanesthetic Checklist Completed: patient identified, IV checked, site marked, risks and benefits discussed, surgical consent, monitors and equipment checked, pre-op evaluation and timeout performed Spinal Block Patient position: sitting Prep: DuraPrep Patient monitoring: heart rate, cardiac monitor, continuous pulse ox and blood pressure Approach: midline Location: L4-5 Injection technique: single-shot Needle Needle type: Pencan  Needle gauge: 24 G Needle length: 9 cm Assessment Sensory level: T4

## 2019-12-29 NOTE — Anesthesia Procedure Notes (Signed)
Procedure Name: MAC Date/Time: 12/29/2019 8:08 AM Performed by: Eben Burow, CRNA Pre-anesthesia Checklist: Patient identified, Emergency Drugs available, Suction available, Patient being monitored and Timeout performed Oxygen Delivery Method: Simple face mask Placement Confirmation: positive ETCO2

## 2019-12-29 NOTE — Anesthesia Procedure Notes (Signed)
Anesthesia Regional Block: Adductor canal block   Pre-Anesthetic Checklist: ,, timeout performed, Correct Patient, Correct Site, Correct Laterality, Correct Procedure, Correct Position, site marked, Risks and benefits discussed,  Surgical consent,  Pre-op evaluation,  At surgeon's request and post-op pain management  Laterality: Left  Prep: chloraprep       Needles:  Injection technique: Single-shot  Needle Type: Echogenic Needle     Needle Length: 9cm  Needle Gauge: 21     Additional Needles:   Procedures:,,,, ultrasound used (permanent image in chart),,,,  Narrative:  Start time: 12/29/2019 7:36 AM End time: 12/29/2019 7:41 AM Injection made incrementally with aspirations every 5 mL.  Performed by: Personally  Anesthesiologist: Suzette Battiest, MD

## 2019-12-29 NOTE — H&P (Signed)
Arthur King MRN:  [id number] DOB/SEX:  1950/07/19/male  CHIEF COMPLAINT:  Painful left Knee  HISTORY: Patient is a 69 y.o. male presented with a history of pain in the left knee. Onset of symptoms was gradual starting a few years ago with gradually worsening course since that time. Patient has been treated conservatively with over-the-counter NSAIDs and activity modification. Patient currently rates pain in the knee at 10 out of 10 with activity. There is pain at night.  PAST MEDICAL HISTORY: There are no problems to display for this patient.  Past Medical History:  Diagnosis Date  . Arthritis   . Childhood asthma   . DVT, lower extremity, recurrent, left ([place]) 1974  . Hearing loss   . Hypertension   . Obese   . OSA on CPAP   . Pre-diabetes   . Prostate cancer ([place])   . Protein S deficiency ([place])   . Vitamin D deficiency    Past Surgical History:  Procedure Laterality Date  . APPENDECTOMY    . CHOLECYSTECTOMY    . HERNIA REPAIR     Abdominal  . toe nail removal Bilateral   . TONSILLECTOMY    . VEIN LIGATION AND STRIPPING Left      MEDICATIONS:   Medications Prior to Admission  Medication Sig Dispense Refill Last Dose  . AMLODIPINE BESYLATE PO Take 20 mg by mouth every morning.   12/29/2019 at 0500  . lisinopril-hydrochlorothiazide (ZESTORETIC) 20-25 MG tablet Take 1 tablet by mouth every morning.   12/28/2019 at Unknown time  . Multiple Vitamin (MULTIVITAMIN) capsule Take 1 capsule by mouth daily.   Past Week at Unknown time  . warfarin (COUMADIN) 6 MG tablet Take 6 mg by mouth daily at 4 PM.   12/24/2019    ALLERGIES:   Allergies  Allergen Reactions  . Neosporin [Bacitracin-Polymyxin B] Rash    REVIEW OF SYSTEMS:  A comprehensive review of systems was negative except for: Musculoskeletal: positive for arthralgias and bone pain   FAMILY HISTORY:  History reviewed. No pertinent family history.  SOCIAL HISTORY:   Social History   Tobacco Use  . Smoking  status: Never Smoker  . Smokeless tobacco: Never Used  Substance Use Topics  . Alcohol use: Never     EXAMINATION:  Vital signs in last 24 hours: Temp:  [98 F (36.7 C)] 98 F (36.7 C) (12/06 0539) Pulse Rate:  [73] 73 (12/06 0539) Resp:  [19] 19 (12/06 0539) BP: (159)/(84) 159/84 (12/06 0539) SpO2:  [99 %] 99 % (12/06 0539)  BP (!) 159/84   Pulse 73   Temp 98 F (36.7 C) (Oral)   Resp 19   SpO2 99%   General Appearance:    Alert, cooperative, no distress, appears stated age  Head:    Normocephalic, without obvious abnormality, atraumatic  Eyes:    PERRL, conjunctiva/corneas clear, EOM's intact, fundi    benign, both eyes       Ears:    Normal TM's and external ear canals, both ears  Nose:   Nares normal, septum midline, mucosa normal, no drainage    or sinus tenderness  Throat:   Lips, mucosa, and tongue normal; teeth and gums normal  Neck:   Supple, symmetrical, trachea midline, no adenopathy;       thyroid:  No enlargement/tenderness/nodules; no carotid   bruit or JVD  Back:     Symmetric, no curvature, ROM normal, no CVA tenderness  Lungs:     Clear to auscultation bilaterally,  respirations unlabored  Chest wall:    No tenderness or deformity  Heart:    Regular rate and rhythm, S1 and S2 normal, no murmur, rub   or gallop  Abdomen:     Soft, non-tender, bowel sounds active all four quadrants,    no masses, no organomegaly  Genitalia:    Normal male without lesion, discharge or tenderness  Rectal:    Normal tone, normal prostate, no masses or tenderness;   guaiac negative stool  Extremities:   Extremities normal, atraumatic, no cyanosis or edema  Pulses:   2+ and symmetric all extremities  Skin:   Skin color, texture, turgor normal, no rashes or lesions  Lymph nodes:   Cervical, supraclavicular, and axillary nodes normal  Neurologic:   CNII-XII intact. Normal strength, sensation and reflexes      throughout    Musculoskeletal:  ROM 0-120, Ligaments intact,   Imaging Review Plain radiographs demonstrate severe degenerative joint disease of the left knee. The overall alignment is neutral. The bone quality appears to be good for age and reported activity level.  Assessment/Plan: Primary osteoarthritis, left knee   The patient history, physical examination and imaging studies are consistent with advanced degenerative joint disease of the left knee. The patient has failed conservative treatment.  The clearance notes were reviewed.  After discussion with the patient it was felt that Total Knee Replacement was indicated. The procedure,  risks, and benefits of total knee arthroplasty were presented and reviewed. The risks including but not limited to aseptic loosening, infection, blood clots, vascular injury, stiffness, patella tracking problems complications among others were discussed. The patient acknowledged the explanation, agreed to proceed with the plan.  Preoperative templating of the joint replacement has been completed, documented, and submitted to the Operating Room personnel in order to optimize intra-operative equipment management.    Patient's anticipated LOS is less than 2 midnights, meeting these requirements: - Lives within 1 hour of care - Has a competent adult at home to recover with post-op recover - NO history of  - Chronic pain requiring opiods  - Diabetes  - Coronary Artery Disease  - Heart failure  - Heart attack  - Stroke  - DVT/VTE  - Cardiac arrhythmia  - Respiratory Failure/COPD  - Renal failure  - Anemia  - Advanced Liver disease       [name] 12/29/2019, 7:02 AM

## 2019-12-29 NOTE — Transfer of Care (Signed)
Immediate Anesthesia Transfer of Care Note  Patient: Arthur King  Procedure(s) Performed: TOTAL KNEE ARTHROPLASTY (Left Knee)  Patient Location: PACU  Anesthesia Type:Spinal  Level of Consciousness: awake, alert  and patient cooperative  Airway & Oxygen Therapy: Patient Spontanous Breathing and Patient connected to face mask oxygen  Post-op Assessment: Report given to RN and Post -op Vital signs reviewed and stable  Post vital signs: Reviewed and stable  Last Vitals:  Vitals Value Taken Time  BP    Temp    Pulse    Resp    SpO2      Last Pain:  Vitals:   12/29/19 0539  TempSrc: Oral         Complications: No complications documented.

## 2019-12-29 NOTE — Evaluation (Signed)
Physical Therapy Evaluation Patient Details Name: Arthur King MRN: [id number] DOB: December 08, 1950 Today's Date: 12/29/2019   History of Present Illness  Patient is 69 y.o. male s/p Lt TKA on 12/29/19 with PMH significant for prostate cancer,  HTN, DVT, OA.  Clinical Impression  NAPOLEAN SIA is a 69 y.o. male POD 0 s/p Lt TKA. Patient reports independence with mobility at baseline. Patient is now limited by functional impairments (see PT problem list below) and requires min assist for transfers and gait with RW. Patient was able to ambulate ~100 feet with RW and min guard/assist. Patient instructed in exercise to facilitate ROM and circulation. Patient will benefit from continued skilled PT interventions to address impairments and progress towards PLOF. Acute PT will follow to progress mobility and stair training in preparation for safe discharge home.     Follow Up Recommendations Follow surgeon's recommendation for DC plan and follow-up therapies    Equipment Recommendations  Rolling walker with 5" wheels    Recommendations for Other Services       Precautions / Restrictions Precautions Precautions: Fall Restrictions Weight Bearing Restrictions: No LLE Weight Bearing: Weight bearing as tolerated      Mobility  Bed Mobility Overal bed mobility: Needs Assistance Bed Mobility: Supine to Sit     Supine to sit: Min assist;HOB elevated     General bed mobility comments: cues to reach for bed rail, assist to fully raise trunk upright    Transfers Overall transfer level: Needs assistance Equipment used: Rolling walker (2 wheeled) Transfers: Sit to/from Stand Sit to Stand: Min assist         General transfer comment: cues for technique with RW, assist to initiate and complete power up. pt steady once standing.   Ambulation/Gait Ambulation/Gait assistance: Min assist;Min guard Gait Distance (Feet): 100 Feet Assistive device: Rolling walker (2 wheeled) Gait  Pattern/deviations: Step-to pattern;Step-through pattern;Decreased stride length;Decreased weight shift to left Gait velocity: decr   General Gait Details: VC's for step patttern, pt progressing to step through pattern during gait. No overt LOB or buckling at Lt knee observed. cues to maintain safe proximity to RW at start and pt followed.   Stairs            Wheelchair Mobility    Modified Rankin (Stroke Patients Only)       Balance Overall balance assessment: Needs assistance Sitting-balance support: Feet supported Sitting balance-Leahy Scale: Good     Standing balance support: During functional activity;Bilateral upper extremity supported Standing balance-Leahy Scale: Fair                               Pertinent Vitals/Pain Pain Assessment: 0-10 Pain Score: 4  Pain Location: Lt knee Pain Descriptors / Indicators: Discomfort Pain Intervention(s): Limited activity within patient's tolerance;Monitored during session;Repositioned;Ice applied    Home Living Family/patient expects to be discharged to:: Private residence Living Arrangements: Spouse/significant other;Children Available Help at Discharge: Family Type of Home: House Home Access: Stairs to enter Entrance Stairs-Rails: None Technical brewer of Steps: 1+1 Home Layout: One level Home Equipment: Grab bars - tub/shower      Prior Function Level of Independence: Independent               Hand Dominance   Dominant Hand: Right    Extremity/Trunk Assessment   Upper Extremity Assessment Upper Extremity Assessment: Overall WFL for tasks assessed    Lower Extremity Assessment Lower Extremity Assessment: LLE deficits/detail  LLE Deficits / Details: good quad activation, no extensor lag with SLR, 3/5 or better LLE Sensation: WNL LLE Coordination: WNL    Cervical / Trunk Assessment Cervical / Trunk Assessment: Normal  Communication   Communication: No difficulties  Cognition  Arousal/Alertness: Awake/alert Behavior During Therapy: WFL for tasks assessed/performed Overall Cognitive Status: Within Functional Limits for tasks assessed                                        General Comments      Exercises Total Joint Exercises Ankle Circles/Pumps: AROM;Both;20 reps;Seated Quad Sets: AROM;10 reps;Seated;Left Heel Slides: AROM;10 reps;Seated;Left   Assessment/Plan    PT Assessment Patient needs continued PT services  PT Problem List Decreased strength;Decreased range of motion;Decreased activity tolerance;Decreased balance;Decreased mobility;Decreased knowledge of use of DME;Decreased knowledge of precautions;Obesity;Pain       PT Treatment Interventions DME instruction;Gait training;Stair training;Functional mobility training;Therapeutic activities;Therapeutic exercise;Balance training;Patient/family education    PT Goals (Current goals can be found in the Care Plan section)  Acute Rehab PT Goals Patient Stated Goal: to walk more and take care of his grandson PT Goal Formulation: With patient Time For Goal Achievement: 01/05/20 Potential to Achieve Goals: Good    Frequency 7X/week   Barriers to discharge        Co-evaluation               AM-PAC PT "6 Clicks" Mobility  Outcome Measure Help needed turning from your back to your side while in a flat bed without using bedrails?: A Little Help needed moving from lying on your back to sitting on the side of a flat bed without using bedrails?: A Little Help needed moving to and from a bed to a chair (including a wheelchair)?: A Little Help needed standing up from a chair using your arms (e.g., wheelchair or bedside chair)?: A Little Help needed to walk in hospital room?: A Little Help needed climbing 3-5 steps with a railing? : A Little 6 Click Score: 18    End of Session Equipment Utilized During Treatment: Gait belt Activity Tolerance: Patient tolerated treatment  well Patient left: in chair;with call bell/phone within reach;with chair alarm set;with family/visitor present Nurse Communication: Mobility status PT Visit Diagnosis: Muscle weakness (generalized) (M62.81);Difficulty in walking, not elsewhere classified (R26.2);Pain Pain - Right/Left: Left Pain - part of body: Knee    Time: 1551-1620 PT Time Calculation (min) (ACUTE ONLY): 29 min   Charges:   PT Evaluation $PT Eval Low Complexity: 1 Low PT Treatments $Gait Training: 8-22 mins        Verner Mould, DPT Acute Rehabilitation Services  Office [phone] Pager [phone]  12/29/2019 4:36 PM

## 2019-12-29 NOTE — Progress Notes (Signed)
ANTICOAGULATION CONSULT NOTE - Initial Consult  Pharmacy Consult for Warfarin Indication: VTE prophylaxis and hx DVT  Allergies  Allergen Reactions  . Neosporin [Bacitracin-Polymyxin B] Rash    Patient Measurements: Height: 5\' 10"  (177.8 cm) Weight: 114.3 kg (251 lb 15.8 oz) IBW/kg (Calculated) : 73  Vital Signs: Temp: 97.3 F (36.3 C) (12/06 1149) Temp Source: Oral (12/06 1149) BP: 126/69 (12/06 1149) Pulse Rate: 59 (12/06 1149)  Labs: Recent Labs    12/29/19 0555  HGB 13.9  HCT 42.1  PLT 224  LABPROT 14.9  INR 1.2  CREATININE 1.13    Estimated Creatinine Clearance: 78.1 mL/min (by C-G formula based on SCr of 1.13 mg/dL).   Medical History: Past Medical History:  Diagnosis Date  . Arthritis   . Childhood asthma   . DVT, lower extremity, recurrent, left ([place]) 1974  . Hearing loss   . Hypertension   . Obese   . OSA on CPAP   . Pre-diabetes   . Prostate cancer ([place])   . Protein S deficiency ([place])   . Vitamin D deficiency     Medications:  Scheduled:  . acetaminophen  1,000 mg Oral Q6H  . [START ON 12/30/2019] amLODipine  10 mg Oral Daily  . [START ON 12/30/2019] dexamethasone (DECADRON) injection  10 mg Intravenous Once  . docusate sodium  100 mg Oral BID  . [START ON 12/30/2019] enoxaparin (LOVENOX) injection  40 mg Subcutaneous Q24H  . [START ON 12/30/2019] ferrous sulfate  325 mg Oral TID PC  . gabapentin  300 mg Oral TID  . [START ON 12/30/2019] lisinopril  20 mg Oral Daily   And  . [START ON 12/30/2019] hydrochlorothiazide  25 mg Oral Daily  . [START ON 12/30/2019] pantoprazole  40 mg Oral Daily   Infusions:  . sodium chloride 75 mL/hr at 12/29/19 1210  .  ceFAZolin (ANCEF) IV    . methocarbamol (ROBAXIN) IV    . tranexamic acid     PRN: alum & mag hydroxide-simeth, bisacodyl, diphenhydrAMINE, HYDROmorphone (DILAUDID) injection, menthol-cetylpyridinium **OR** phenol, methocarbamol **OR** methocarbamol (ROBAXIN) IV, metoCLOPramide **OR**  metoCLOPramide (REGLAN) injection, ondansetron **OR** ondansetron (ZOFRAN) IV, oxyCODONE, senna-docusate, sodium phosphate, zolpidem  Assessment: 69 yo male with hx DVT on chronic warfarin tx, admitted for L TKA.  Last dose of warfarin was 12/1, INR 1.2 today.  Pharmacy consulted to resume warfarin dosing post-op.  Goal of Therapy:  INR 2-3   Plan:  Warfarin 7.5mg  PO x 1 today Daily PT/INR LMWH 40mg  daily until INR >/+ 2.0  Peggyann Juba, PharmD, BCPS Pharmacy: [phone] 12/29/2019,12:36 PM

## 2019-12-29 NOTE — Anesthesia Postprocedure Evaluation (Signed)
Anesthesia Post Note  Patient: Arthur King  Procedure(s) Performed: TOTAL KNEE ARTHROPLASTY (Left Knee)     Patient location during evaluation: PACU Anesthesia Type: Spinal Level of consciousness: awake and alert Pain management: pain level controlled Vital Signs Assessment: post-procedure vital signs reviewed and stable Respiratory status: spontaneous breathing and respiratory function stable Cardiovascular status: blood pressure returned to baseline and stable Postop Assessment: spinal receding Anesthetic complications: no   No complications documented.  Last Vitals:  Vitals:   12/29/19 1130 12/29/19 1149  BP: 118/67 126/69  Pulse: 65 (!) 59  Resp: 12 16  Temp:  (!) 36.3 C  SpO2: 100% 97%    Last Pain:  Vitals:   12/29/19 1149  TempSrc: Oral  PainSc:                  Tiajuana Amass

## 2019-12-30 ENCOUNTER — Encounter (HOSPITAL_COMMUNITY): Payer: Self-pay | Admitting: Orthopedic Surgery

## 2019-12-30 DIAGNOSIS — M1711 Unilateral primary osteoarthritis, right knee: Secondary | ICD-10-CM | POA: Diagnosis not present

## 2019-12-30 LAB — CBC
HCT: 39.2 % (ref 39.0–52.0)
Hemoglobin: 13 g/dL (ref 13.0–17.0)
MCH: 30.4 pg (ref 26.0–34.0)
MCHC: 33.2 g/dL (ref 30.0–36.0)
MCV: 91.6 fL (ref 80.0–100.0)
Platelets: 212 10*3/uL (ref 150–400)
RBC: 4.28 MIL/uL (ref 4.22–5.81)
RDW: 13.2 % (ref 11.5–15.5)
WBC: 10.3 10*3/uL (ref 4.0–10.5)
nRBC: 0 % (ref 0.0–0.2)

## 2019-12-30 LAB — BASIC METABOLIC PANEL
Anion gap: 10 (ref 5–15)
BUN: 19 mg/dL (ref 8–23)
CO2: 23 mmol/L (ref 22–32)
Calcium: 8.4 mg/dL — ABNORMAL LOW (ref 8.9–10.3)
Chloride: 105 mmol/L (ref 98–111)
Creatinine, Ser: 1.19 mg/dL (ref 0.61–1.24)
GFR, Estimated: >60 mL/min (ref >60)
Glucose, Bld: 158 mg/dL — ABNORMAL HIGH (ref 70–99)
Potassium: 3.9 mmol/L (ref 3.5–5.1)
Sodium: 138 mmol/L (ref 135–145)

## 2019-12-30 LAB — PROTIME-INR
INR: 1.2 (ref 0.8–1.2)
Prothrombin Time: 14.6 seconds (ref 11.4–15.2)

## 2019-12-30 MED ORDER — GABAPENTIN 300 MG PO CAPS
300.0000 mg | ORAL_CAPSULE | Freq: Three times a day (TID) | ORAL | 0 refills | Status: DC
Start: 2019-12-30 — End: 2020-02-16

## 2019-12-30 MED ORDER — TIZANIDINE HCL 4 MG PO TABS
4.0000 mg | ORAL_TABLET | Freq: Four times a day (QID) | ORAL | 0 refills | Status: DC | PRN
Start: 2019-12-30 — End: 2020-02-16

## 2019-12-30 MED ORDER — WARFARIN SODIUM 5 MG PO TABS: 7.5000 mg | ORAL_TABLET | Freq: Once | ORAL | Status: DC

## 2019-12-30 MED ORDER — OXYCODONE HCL 5 MG PO TABS
5.0000 mg | ORAL_TABLET | Freq: Four times a day (QID) | ORAL | 0 refills | Status: DC | PRN
Start: 2019-12-30 — End: 2020-02-16

## 2019-12-30 NOTE — Progress Notes (Signed)
ANTICOAGULATION CONSULT NOTE - Initial Consult  Pharmacy Consult for Warfarin Indication: VTE prophylaxis and hx DVT  Allergies  Allergen Reactions  . Neosporin [Bacitracin-Polymyxin B] Rash    Patient Measurements: Height: 5\' 10"  (177.8 cm) Weight: 114.3 kg (251 lb 15.8 oz) IBW/kg (Calculated) : 73  Vital Signs: Temp: 98 F (36.7 C) (12/07 0548) Temp Source: Oral (12/07 0548) BP: 128/69 (12/07 0548) Pulse Rate: 57 (12/07 0548)  Labs: Recent Labs    12/29/19 0555 12/30/19 0330  HGB 13.9 13.0  HCT 42.1 39.2  PLT 224 212  LABPROT 14.9 14.6  INR 1.2 1.2  CREATININE 1.13 1.19    Estimated Creatinine Clearance: 74.2 mL/min (by C-G formula based on SCr of 1.19 mg/dL).   Medical History: Past Medical History:  Diagnosis Date  . Arthritis   . Childhood asthma   . DVT, lower extremity, recurrent, left ([place]) 1974  . Hearing loss   . Hypertension   . Obese   . OSA on CPAP   . Pre-diabetes   . Prostate cancer ([place])   . Protein S deficiency ([place])   . Vitamin D deficiency     Medications:  Scheduled:  . amLODipine  10 mg Oral Daily  . dexamethasone (DECADRON) injection  10 mg Intravenous Once  . docusate sodium  100 mg Oral BID  . enoxaparin (LOVENOX) injection  40 mg Subcutaneous Q24H  . ferrous sulfate  325 mg Oral TID PC  . gabapentin  300 mg Oral TID  . lisinopril  20 mg Oral Daily   And  . hydrochlorothiazide  25 mg Oral Daily  . pantoprazole  40 mg Oral Daily  . Warfarin - Pharmacist Dosing Inpatient   Does not apply q1600   Infusions:  . sodium chloride 75 mL/hr at 12/30/19 0224  . methocarbamol (ROBAXIN) IV     PRN: alum & mag hydroxide-simeth, bisacodyl, diphenhydrAMINE, HYDROmorphone (DILAUDID) injection, menthol-cetylpyridinium **OR** phenol, methocarbamol **OR** methocarbamol (ROBAXIN) IV, metoCLOPramide **OR** metoCLOPramide (REGLAN) injection, ondansetron **OR** ondansetron (ZOFRAN) IV, oxyCODONE, senna-docusate, sodium phosphate,  zolpidem  Assessment: 69 yo male with hx DVT on chronic warfarin tx, admitted for L TKA.  Last dose of warfarin was 12/1, INR 1.2 today.  Pharmacy consulted to resume warfarin dosing post-op.  12/30/2019   INR 1.2, unchanged as expected after resuming warfarin last PM  CBC: wnl  No bleeding reported  No drug-drug interactions noted  Goal of Therapy:  INR 2-3   Plan:  Repeat Warfarin 7.5mg  PO x 1 today Daily PT/INR LMWH 40mg  daily until INR >/= 2.0  Peggyann Juba, PharmD, BCPS Pharmacy: [phone] 12/30/2019,7:29 AM

## 2019-12-30 NOTE — Progress Notes (Signed)
Physical Therapy Treatment Patient Details Name: Arthur King MRN: [id number] DOB: 31-Mar-1950 Today's Date: 12/30/2019    History of Present Illness Patient is 69 y.o. male s/p Lt TKA on 12/29/19 with PMH significant for prostate cancer,  HTN, DVT, OA.    PT Comments    POD # 1 am session Pt in bed feeling "good" but "tired".  Stated he slept in his Blue Block "all night".  Assisted OOB.  General bed mobility comments: increased time but self able to slide L LE off bed. General transfer comment: 25% VC's on proper hand placement and increased time.  Also required an elevated saurface due to Hx back. General Gait Details: 255 VC's on proper walker to self distance and safety with turns.  Tolerated an increased distance.  Biggest c/o was "stiffness", "tightness".  Pt admits to sleeping with LE in blue foam last night. Practiced one step pt has to enter home.  Then returned to room to perform some TE's following HEP handout.  Instructed on proper tech, freq as well as use of ICE.   Addressed all mobility questions, discussed appropriate activity, educated on use of ICE.  Pt ready for D/C to home after one PT session. [place] delivered and in room.  Reported to RN.  Pt has yet to void.      Follow Up Recommendations  Follow surgeon's recommendation for DC plan and follow-up therapies;Outpatient PT (starts next Monday per pt)     Equipment Recommendations  Rolling walker with 5" wheels (delivered and in room)    Recommendations for Other Services       Precautions / Restrictions Precautions Precautions: Fall Precaution Comments: instructed no pillow under knee Restrictions Weight Bearing Restrictions: No LLE Weight Bearing: Weight bearing as tolerated    Mobility  Bed Mobility Overal bed mobility: Needs Assistance Bed Mobility: Supine to Sit     Supine to sit: Supervision     General bed mobility comments: increased time but self able to slide L LE off bed  Transfers Overall  transfer level: Needs assistance Equipment used: Rolling walker (2 wheeled) Transfers: Sit to/from Stand Sit to Stand: Min guard;Supervision         General transfer comment: 255 VC's on proper hand placement and increased time.  Also required an elevated saurface due to Hx back  Ambulation/Gait Ambulation/Gait assistance: Supervision Gait Distance (Feet): 125 Feet Assistive device: Rolling walker (2 wheeled) Gait Pattern/deviations: Step-to pattern;Step-through pattern;Decreased stride length;Decreased weight shift to left Gait velocity: decr   General Gait Details: 255 VC's on proper walker to self distance and safety with turns.  Tolerated an increased distance.  Biggest c/o was "stiffness", "tightness".  Pt admits to sleeping with LE in blue foam last night.   Stairs Stairs: Yes Stairs assistance: Min guard Stair Management: No rails Number of Stairs: 1 General stair comments: 255 VC's on proper walker placement and sequencing.  Performed well twice.   Wheelchair Mobility    Modified Rankin (Stroke Patients Only)       Balance                                            Cognition   Behavior During Therapy: WFL for tasks assessed/performed Overall Cognitive Status: Within Functional Limits for tasks assessed  General Comments: AxO x 3 very motivated      Exercises   Total Knee Replacement TE's following HEP handout 10 reps B LE ankle pumps 05 reps towel squeezes 05 reps knee presses 05 reps heel slides  05 reps SAQ's 05 reps SLR's 05 reps ABD Educated on use of gait belt to assist with TE's Followed by ICE     General Comments        Pertinent Vitals/Pain Pain Assessment: 0-10 Pain Score: 5  Pain Location: Lt knee with TE's Pain Descriptors / Indicators: Discomfort;Tender;Tightness Pain Intervention(s): Monitored during session;Premedicated before session;Repositioned;Ice applied     Home Living                      Prior Function            PT Goals (current goals can now be found in the care plan section) Progress towards PT goals: Progressing toward goals    Frequency    7X/week      PT Plan Current plan remains appropriate    Co-evaluation              AM-PAC PT "6 Clicks" Mobility   Outcome Measure  Help needed turning from your back to your side while in a flat bed without using bedrails?: None Help needed moving from lying on your back to sitting on the side of a flat bed without using bedrails?: None Help needed moving to and from a bed to a chair (including a wheelchair)?: None Help needed standing up from a chair using your arms (e.g., wheelchair or bedside chair)?: None Help needed to walk in hospital room?: None Help needed climbing 3-5 steps with a railing? : A Little 6 Click Score: 23    End of Session Equipment Utilized During Treatment: Gait belt Activity Tolerance: Patient tolerated treatment well Patient left: in chair;with call bell/phone within reach;with chair alarm set;with family/visitor present Nurse Communication: Mobility status (pt has met Therapy goals in one session) PT Visit Diagnosis: Muscle weakness (generalized) (M62.81);Difficulty in walking, not elsewhere classified (R26.2);Pain Pain - Right/Left: Left Pain - part of body: Knee     Time: 3225-6720 PT Time Calculation (min) (ACUTE ONLY): 29 min  Charges:  $Gait Training: 8-22 mins $Therapeutic Exercise: 8-22 mins                     Rica Koyanagi  PTA Acute  Rehabilitation Services Pager      [phone] Office      [phone]

## 2019-12-30 NOTE — TOC Transition Note (Signed)
Transition of Care [facility]) - CM/SW Discharge Note   Patient Details  Name: Arthur King MRN: [id number] Date of Birth: [birth year]  Transition [facility]) CM/SW Contact:  Lia Hopping, [place] Phone Number: 12/30/2019, 12:08 PM   Clinical Narrative:    Therapy Plan: Transition to OPPT  Patient has DME   Final next level of care: OP Rehab Barriers to Discharge: Barriers Resolved   Patient Goals and CMS Choice        Discharge Placement                       Discharge Plan and Services                                     Social Determinants of Health (SDOH) Interventions     Readmission Risk Interventions No flowsheet data found.

## 2019-12-30 NOTE — Progress Notes (Signed)
SPORTS MEDICINE AND JOINT REPLACEMENT  Lara Mulch, MD    Carlyon Shadow, PA-C [place], [place], [place]  [address]                             [phone]   PROGRESS NOTE  Subjective:  negative for Chest Pain  negative for Shortness of Breath  negative for Nausea/Vomiting   negative for Calf Pain  negative for Bowel Movement   Tolerating Diet: yes         Patient reports pain as 3 on 0-10 scale.    Objective: Vital signs in last 24 hours:   Patient Vitals for the past 24 hrs:  BP Temp Temp src Pulse Resp SpO2 Height Weight  12/30/19 0548 128/69 98 F (36.7 C) Oral (!) 57 16 97 % - -  12/30/19 0206 (!) 154/74 98.1 F (36.7 C) Oral 60 16 95 % - -  12/30/19 0011 - - - 69 - 96 % - -  12/29/19 2151 126/71 (!) 97.4 F (36.3 C) Oral (!) 59 16 97 % - -  12/29/19 1627 123/66 98 F (36.7 C) Oral 68 16 94 % - -  12/29/19 1457 129/65 97.9 F (36.6 C) Oral 62 18 98 % - -  12/29/19 1344 132/67 97.8 F (36.6 C) Oral 68 17 98 % - -  12/29/19 1149 126/69 (!) 97.3 F (36.3 C) Oral (!) 59 16 97 % 5\' 10"  (1.778 m) 114.3 kg  12/29/19 1130 118/67 - - 65 12 100 % - -  12/29/19 1115 (!) 128/58 - - 76 14 100 % - -  12/29/19 1100 136/68 97.6 F (36.4 C) - 71 14 100 % - -  12/29/19 1058 - - - 69 14 100 % - -  12/29/19 1045 121/70 - - 63 11 98 % - -  12/29/19 1030 (!) 121/58 - - 66 10 99 % - -  12/29/19 1015 131/70 - - 70 11 98 % - -  12/29/19 0957 131/64 (!) 97.5 F (36.4 C) - 65 13 100 % - -  12/29/19 0749 119/63 - - (!) 57 13 99 % - -    @flow {1959:LAST@   Intake/Output from previous day:   12/06 0701 - 12/07 0700 In: 2715.5 [P.O.:700; I.V.:1615.5] Out: 6144 [Urine:3655]   Intake/Output this shift:   12/07 0701 - 12/07 1900 In: 120 [P.O.:120] Out: -    Intake/Output      12/06 0701 - 12/07 0700 12/07 0701 - 12/08 0700   P.O. 700 120   I.V. (mL/kg) 1615.5 (14.1)    IV Piggyback 400    Total Intake(mL/kg) 2715.5 (23.8) 120 (1)   Urine (mL/kg/hr) 3655 (1.3)     Blood 20    Total Output 3675    Net -959.5 +120           LABORATORY DATA: Recent Labs    12/29/19 0555 12/30/19 0330  WBC 5.3 10.3  HGB 13.9 13.0  HCT 42.1 39.2  PLT 224 212   Recent Labs    12/29/19 0555 12/30/19 0330  NA 136 138  K 3.2* 3.9  CL 101 105  CO2 23 23  BUN 22 19  CREATININE 1.13 1.19  GLUCOSE 130* 158*  CALCIUM 9.2 8.4*   Lab Results  Component Value Date   INR 1.2 12/30/2019   INR 1.2 12/29/2019    Examination:  General appearance: alert, cooperative and no distress  Extremities: extremities normal, atraumatic, no cyanosis or edema  Wound Exam: clean, dry, intact   Drainage:  None: wound tissue dry  Motor Exam: Quadriceps and Hamstrings Intact  Sensory Exam: Superficial Peroneal, Deep Peroneal and Tibial normal   Assessment:    1 Day Post-Op  Procedure(s) (LRB): TOTAL KNEE ARTHROPLASTY (Left)  ADDITIONAL DIAGNOSIS:  Active Problems:   S/P total knee replacement     Plan: Physical Therapy as ordered Weight Bearing as Tolerated (WBAT)  DVT Prophylaxis:  Coumadin  DISCHARGE PLAN: Home  Plan for D/C home today after therapy       Patient's anticipated LOS is less than 2 midnights, meeting these requirements: - Lives within 1 hour of care - Has a competent adult at home to recover with post-op recover - NO history of  - Chronic pain requiring opiods  - Diabetes  - Coronary Artery Disease  - Heart failure  - Heart attack  - Stroke  - DVT/VTE  - Cardiac arrhythmia  - Respiratory Failure/COPD  - Renal failure  - Anemia  - Advanced Liver disease        [name] 12/30/2019, 7:44 AM

## 2019-12-30 NOTE — Discharge Summary (Signed)
SPORTS MEDICINE & JOINT REPLACEMENT   Lara Mulch, MD   Carlyon Shadow, PA-C [place], [place], [place]  [address]                             [phone]  PATIENT ID: Arthur King        MRN:  [id number]          DOB/AGE: 1950-07-10 / 69 y.o.    DISCHARGE SUMMARY  ADMISSION DATE:    12/29/2019 DISCHARGE DATE:   12/30/2019   ADMISSION DIAGNOSIS: S/P total knee replacement [Z96.659]    DISCHARGE DIAGNOSIS:  Osteoarthritis of left knee M17.11    ADDITIONAL DIAGNOSIS: Active Problems:   S/P total knee replacement  Past Medical History:  Diagnosis Date  . Arthritis   . Childhood asthma   . DVT, lower extremity, recurrent, left ([place]) 1974  . Hearing loss   . Hypertension   . Obese   . OSA on CPAP   . Pre-diabetes   . Prostate cancer ([place])   . Protein S deficiency ([place])   . Vitamin D deficiency     PROCEDURE: Procedure(s): TOTAL KNEE ARTHROPLASTY on 12/29/2019  CONSULTS:    HISTORY:  See H&P in chart  HOSPITAL COURSE:  Arthur King is a 69 y.o. admitted on 12/29/2019 and found to have a diagnosis of Osteoarthritis of left knee M17.11.  After appropriate laboratory studies were obtained  they were taken to the operating room on 12/29/2019 and underwent Procedure(s): TOTAL KNEE ARTHROPLASTY.   They were given perioperative antibiotics:  Anti-infectives (From admission, onward)   Start     Dose/Rate Route Frequency Ordered Stop   12/29/19 1400  ceFAZolin (ANCEF) IVPB 2g/100 mL premix        2 g 200 mL/hr over 30 Minutes Intravenous Every 6 hours 12/29/19 1159 12/29/19 2034   12/29/19 0600  ceFAZolin (ANCEF) IVPB 2g/100 mL premix        2 g 200 mL/hr over 30 Minutes Intravenous On call to O.R. 12/29/19 9935 12/29/19 7017    .  Patient given tranexamic acid IV or topical and exparel intra-operatively.  Tolerated the procedure well.    POD# 1: Vital signs were stable.  Patient denied Chest pain, shortness of breath, or calf pain.  Patient was started  on Aspirin twice daily at 8am.  Consults to PT, OT, and care management were made.  The patient was weight bearing as tolerated.  CPM was placed on the operative leg 0-90 degrees for 6-8 hours a day. When out of the CPM, patient was placed in the foam block to achieve full extension. Incentive spirometry was taught.  Dressing was changed.       POD #2, Continued  PT for ambulation and exercise program.  IV saline locked.  O2 discontinued.    The remainder of the hospital course was dedicated to ambulation and strengthening.   The patient was discharged on 1 Day Post-Op in  Good condition.  Blood products given:none  DIAGNOSTIC STUDIES: Recent vital signs:  Patient Vitals for the past 24 hrs:  BP Temp Temp src Pulse Resp SpO2 Height Weight  12/30/19 0548 128/69 98 F (36.7 C) Oral (!) 57 16 97 % -- --  12/30/19 0206 (!) 154/74 98.1 F (36.7 C) Oral 60 16 95 % -- --  12/30/19 0011 -- -- -- 69 -- 96 % -- --  12/29/19 2151 126/71 (!) 97.4 F (  36.3 C) Oral (!) 59 16 97 % -- --  12/29/19 1627 123/66 98 F (36.7 C) Oral 68 16 94 % -- --  12/29/19 1457 129/65 97.9 F (36.6 C) Oral 62 18 98 % -- --  12/29/19 1344 132/67 97.8 F (36.6 C) Oral 68 17 98 % -- --  12/29/19 1149 126/69 (!) 97.3 F (36.3 C) Oral (!) 59 16 97 % 5\' 10"  (1.778 m) 114.3 kg  12/29/19 1130 118/67 -- -- 65 12 100 % -- --  12/29/19 1115 (!) 128/58 -- -- 76 14 100 % -- --  12/29/19 1100 136/68 97.6 F (36.4 C) -- 71 14 100 % -- --  12/29/19 1058 -- -- -- 69 14 100 % -- --  12/29/19 1045 121/70 -- -- 63 11 98 % -- --  12/29/19 1030 (!) 121/58 -- -- 66 10 99 % -- --  12/29/19 1015 131/70 -- -- 70 11 98 % -- --  12/29/19 0957 131/64 (!) 97.5 F (36.4 C) -- 65 13 100 % -- --  12/29/19 0749 119/63 -- -- (!) 57 13 99 % -- --       Recent laboratory studies: Recent Labs    12/29/19 0555 12/30/19 0330  WBC 5.3 10.3  HGB 13.9 13.0  HCT 42.1 39.2  PLT 224 212   Recent Labs    12/29/19 0555 12/30/19 0330  NA 136  138  K 3.2* 3.9  CL 101 105  CO2 23 23  BUN 22 19  CREATININE 1.13 1.19  GLUCOSE 130* 158*  CALCIUM 9.2 8.4*   Lab Results  Component Value Date   INR 1.2 12/30/2019   INR 1.2 12/29/2019     Recent Radiographic Studies :  No results found.  DISCHARGE INSTRUCTIONS:   DISCHARGE MEDICATIONS:     FOLLOW UP VISIT:    DISPOSITION: HOME VS. SNF  Dental Antibiotics:  In most cases prophylactic antibiotics for Dental procdeures after total joint surgery are not necessary.  Exceptions are as follows:  1. History of prior total joint infection  2. Severely immunocompromised (Organ Transplant, cancer chemotherapy, Rheumatoid biologic meds such as Arthur King)  3. Poorly controlled diabetes (A1C &gt; 8.0, blood glucose over 200)  If you have one of these conditions, contact your surgeon for an antibiotic prescription, prior to your dental procedure.   CONDITION:  Good   Arthur King 12/30/2019, 7:45 AM

## 2019-12-30 NOTE — Progress Notes (Signed)
Patient verbalizes understanding of all discharge instructions. All questions answered. Awaiting daughter for ride home. No c/o at present.

## 2020-02-05 DIAGNOSIS — Z96652 Presence of left artificial knee joint: Secondary | ICD-10-CM | POA: Diagnosis not present

## 2020-02-05 DIAGNOSIS — M25462 Effusion, left knee: Secondary | ICD-10-CM | POA: Diagnosis not present

## 2020-02-05 DIAGNOSIS — M25662 Stiffness of left knee, not elsewhere classified: Secondary | ICD-10-CM | POA: Diagnosis not present

## 2020-02-05 DIAGNOSIS — Z7409 Other reduced mobility: Secondary | ICD-10-CM | POA: Diagnosis not present

## 2020-02-05 DIAGNOSIS — M25562 Pain in left knee: Secondary | ICD-10-CM | POA: Diagnosis not present

## 2020-02-11 DIAGNOSIS — M25662 Stiffness of left knee, not elsewhere classified: Secondary | ICD-10-CM | POA: Diagnosis not present

## 2020-02-11 DIAGNOSIS — Z7409 Other reduced mobility: Secondary | ICD-10-CM | POA: Diagnosis not present

## 2020-02-11 DIAGNOSIS — M25562 Pain in left knee: Secondary | ICD-10-CM | POA: Diagnosis not present

## 2020-02-11 DIAGNOSIS — M25462 Effusion, left knee: Secondary | ICD-10-CM | POA: Diagnosis not present

## 2020-02-11 DIAGNOSIS — Z96652 Presence of left artificial knee joint: Secondary | ICD-10-CM | POA: Diagnosis not present

## 2020-02-13 DIAGNOSIS — Z7409 Other reduced mobility: Secondary | ICD-10-CM | POA: Diagnosis not present

## 2020-02-13 DIAGNOSIS — Z96652 Presence of left artificial knee joint: Secondary | ICD-10-CM | POA: Diagnosis not present

## 2020-02-13 DIAGNOSIS — M25662 Stiffness of left knee, not elsewhere classified: Secondary | ICD-10-CM | POA: Diagnosis not present

## 2020-02-13 DIAGNOSIS — M25562 Pain in left knee: Secondary | ICD-10-CM | POA: Diagnosis not present

## 2020-02-13 DIAGNOSIS — M25462 Effusion, left knee: Secondary | ICD-10-CM | POA: Diagnosis not present

## 2020-02-16 ENCOUNTER — Encounter: Payer: Self-pay | Admitting: Family Medicine

## 2020-02-16 ENCOUNTER — Other Ambulatory Visit: Payer: Self-pay

## 2020-02-16 ENCOUNTER — Ambulatory Visit (INDEPENDENT_AMBULATORY_CARE_PROVIDER_SITE_OTHER): Payer: PPO | Admitting: Family Medicine

## 2020-02-16 VITALS — BP 119/74 | HR 68 | Temp 98.3°F | Ht 70.0 in | Wt 254.0 lb

## 2020-02-16 DIAGNOSIS — I10 Essential (primary) hypertension: Secondary | ICD-10-CM

## 2020-02-16 DIAGNOSIS — R739 Hyperglycemia, unspecified: Secondary | ICD-10-CM | POA: Diagnosis not present

## 2020-02-16 DIAGNOSIS — C61 Malignant neoplasm of prostate: Secondary | ICD-10-CM

## 2020-02-16 DIAGNOSIS — Z1211 Encounter for screening for malignant neoplasm of colon: Secondary | ICD-10-CM

## 2020-02-16 DIAGNOSIS — D6859 Other primary thrombophilia: Secondary | ICD-10-CM | POA: Diagnosis not present

## 2020-02-16 NOTE — Progress Notes (Signed)
Arthur King is a 70 y.o. male who presents today for an office visit.  Assessment/Plan:  Chronic Problems Addressed Today: Prostate cancer ([place]) Status post radiation therapy.  Will place referral to urology to establish care in the area.  Needs PSA monitoring.  Advised him that he that we could check this going forward once released from urology.  Protein S deficiency (Arthur King) On chronic anticoagulation with warfarin.  He will continue current dose and we will establish him with Coumadin clinic here.  Hyperglycemia Continue lifestyle modifications.  Check A1c next blood draw.  Essential hypertension At goal.  Continue amlodipine 10 mg daily and lisinopril-HCTZ 20-25 once daily.    Subjective:  HPI:  See A/p for status of chronic conditions.  PMH:  The following were reviewed and entered/updated in epic: Past Medical History:  Diagnosis Date  . Arthritis   . Childhood asthma   . DVT, lower extremity, recurrent, left (Arthur King) 1974  . Hearing loss   . Hypertension   . Obese   . OSA on CPAP   . Pre-diabetes   . Prostate cancer ([place])   . Protein S deficiency (Arthur King)   . Vitamin D deficiency    Patient Active Problem List   Diagnosis Date Noted  . Essential hypertension 02/16/2020  . Hyperglycemia 02/16/2020  . Protein S deficiency (Arthur King) 02/16/2020  . Prostate cancer (Arthur King) 02/16/2020  . S/P total knee replacement 12/29/2019   Past Surgical History:  Procedure Laterality Date  . APPENDECTOMY    . CHOLECYSTECTOMY    . HERNIA REPAIR     Abdominal  . toe nail removal Bilateral   . TONSILLECTOMY    . TOTAL KNEE ARTHROPLASTY Left 12/29/2019   Procedure: TOTAL KNEE ARTHROPLASTY;  Surgeon: Vickey Huger, MD;  Location: [facility];  Service: Orthopedics;  Laterality: Left;  [name] VEIN LIGATION AND STRIPPING Left     History reviewed. No pertinent family history.  Medications- reviewed and updated Current Outpatient Medications  Medication Sig Dispense Refill  . amLODipine  (NORVASC) 10 MG tablet Take 10 mg by mouth every morning.     [name] lisinopril-hydrochlorothiazide (ZESTORETIC) 20-25 MG tablet Take 1 tablet by mouth every morning.    . Multiple Vitamins-Minerals (CENTRUM SILVER 50+MEN) TABS Take 1 tablet by mouth daily.    [name] warfarin (COUMADIN) 6 MG tablet Take 6 mg by mouth daily at 4 PM.     No current facility-administered medications for this visit.    Allergies-reviewed and updated Allergies  Allergen Reactions  . Neosporin [Bacitracin-Polymyxin B] Rash    Social History   Socioeconomic History  . Marital status: Married    Spouse name: Not on file  . Number of children: Not on file  . Years of education: Not on file  . Highest education level: Not on file  Occupational History  . Not on file  Tobacco Use  . Smoking status: Never Smoker  . Smokeless tobacco: Never Used  Vaping Use  . Vaping Use: Never used  Substance and Sexual Activity  . Alcohol use: Never  . Drug use: Never  . Sexual activity: Not on file  Other Topics Concern  . Not on file  Social History Narrative  . Not on file   Social Determinants of Health   Financial Resource Strain: Not on file  Food Insecurity: Not on file  Transportation Needs: Not on file  Physical Activity: Not on file  Stress: Not on file  Social Connections: Not on file  Objective:  Physical Exam: BP 119/74   Pulse 68   Temp 98.3 F (36.8 C) (Temporal)   Ht 5\' 10"  (1.778 m)   Wt 254 lb (115.2 kg)   SpO2 98%   BMI 36.45 kg/m   Gen: No acute distress, resting comfortably CV: Regular rate and rhythm with no murmurs appreciated Pulm: Normal work of breathing, clear to auscultation bilaterally with no crackles, wheezes, or rhonchi Neuro: Grossly normal, moves all extremities Psych: Normal affect and thought content      Shirrell Solinger M. Jerline Pain, MD 02/16/2020 2:15 PM

## 2020-02-16 NOTE — Assessment & Plan Note (Signed)
Continue lifestyle modifications.  Check A1c next blood draw.

## 2020-02-16 NOTE — Assessment & Plan Note (Signed)
Status post radiation therapy.  Will place referral to urology to establish care in the area.  Needs PSA monitoring.  Advised him that he that we could check this going forward once released from urology.

## 2020-02-16 NOTE — Patient Instructions (Signed)
It was very nice to see you today!  Please come back soon to see Jenny Reichmann for your Coumadin check.  We will place referral for the urologist for your prostate cancer.  Also place referral for your colonoscopy.  I will see back in December for your annual physical.  Please come back to see me sooner if needed.  Take care, Dr Jerline Pain  Please try these tips to maintain a healthy lifestyle:   Eat at least 3 REAL meals and 1-2 snacks per day.  Aim for no more than 5 hours between eating.  If you eat breakfast, please do so within one hour of getting up.    Each meal should contain half fruits/vegetables, one quarter protein, and one quarter carbs (no bigger than a computer mouse)   Cut down on sweet beverages. This includes juice, soda, and sweet tea.     Drink at least 1 glass of water with each meal and aim for at least 8 glasses per day   Exercise at least 150 minutes every week.

## 2020-02-16 NOTE — Assessment & Plan Note (Signed)
On chronic anticoagulation with warfarin.  He will continue current dose and we will establish him with Coumadin clinic here.

## 2020-02-16 NOTE — Assessment & Plan Note (Signed)
At goal.  Continue amlodipine 10 mg daily and lisinopril-HCTZ 20-25 once daily.

## 2020-02-17 DIAGNOSIS — M25662 Stiffness of left knee, not elsewhere classified: Secondary | ICD-10-CM | POA: Diagnosis not present

## 2020-02-17 DIAGNOSIS — M25562 Pain in left knee: Secondary | ICD-10-CM | POA: Diagnosis not present

## 2020-02-17 DIAGNOSIS — Z96652 Presence of left artificial knee joint: Secondary | ICD-10-CM | POA: Diagnosis not present

## 2020-02-17 DIAGNOSIS — Z7409 Other reduced mobility: Secondary | ICD-10-CM | POA: Diagnosis not present

## 2020-02-17 DIAGNOSIS — M25462 Effusion, left knee: Secondary | ICD-10-CM | POA: Diagnosis not present

## 2020-02-18 ENCOUNTER — Other Ambulatory Visit: Payer: Self-pay

## 2020-02-18 ENCOUNTER — Ambulatory Visit (INDEPENDENT_AMBULATORY_CARE_PROVIDER_SITE_OTHER): Payer: PPO | Admitting: General Practice

## 2020-02-18 DIAGNOSIS — Z7901 Long term (current) use of anticoagulants: Secondary | ICD-10-CM | POA: Diagnosis not present

## 2020-02-18 LAB — POCT INR: INR: 3.5 — AB (ref 2.0–3.0)

## 2020-02-18 NOTE — Patient Instructions (Signed)
Pre visit review using our clinic review tool, if applicable. No additional management support is needed unless otherwise documented below in the visit note.  Skip dosage today (1/26) and then continue to take 1 (6 mg) tablet daily.  Re-check in 3 weeks.

## 2020-02-18 NOTE — Progress Notes (Signed)
I have reviewed the patient's encounter and agree with the documentation.  Arthur King. Jerline Pain, MD 02/18/2020 10:08 AM

## 2020-02-19 DIAGNOSIS — Z7409 Other reduced mobility: Secondary | ICD-10-CM | POA: Diagnosis not present

## 2020-02-19 DIAGNOSIS — M25462 Effusion, left knee: Secondary | ICD-10-CM | POA: Diagnosis not present

## 2020-02-19 DIAGNOSIS — M25662 Stiffness of left knee, not elsewhere classified: Secondary | ICD-10-CM | POA: Diagnosis not present

## 2020-02-19 DIAGNOSIS — Z96652 Presence of left artificial knee joint: Secondary | ICD-10-CM | POA: Diagnosis not present

## 2020-02-19 DIAGNOSIS — M25562 Pain in left knee: Secondary | ICD-10-CM | POA: Diagnosis not present

## 2020-03-01 DIAGNOSIS — M24662 Ankylosis, left knee: Secondary | ICD-10-CM | POA: Diagnosis not present

## 2020-03-01 DIAGNOSIS — Z96652 Presence of left artificial knee joint: Secondary | ICD-10-CM | POA: Diagnosis not present

## 2020-03-01 DIAGNOSIS — T8482XA Fibrosis due to internal orthopedic prosthetic devices, implants and grafts, initial encounter: Secondary | ICD-10-CM | POA: Diagnosis not present

## 2020-03-01 DIAGNOSIS — M25562 Pain in left knee: Secondary | ICD-10-CM | POA: Diagnosis not present

## 2020-03-02 DIAGNOSIS — Z7409 Other reduced mobility: Secondary | ICD-10-CM | POA: Diagnosis not present

## 2020-03-02 DIAGNOSIS — M25562 Pain in left knee: Secondary | ICD-10-CM | POA: Diagnosis not present

## 2020-03-02 DIAGNOSIS — Z96652 Presence of left artificial knee joint: Secondary | ICD-10-CM | POA: Diagnosis not present

## 2020-03-02 DIAGNOSIS — M25662 Stiffness of left knee, not elsewhere classified: Secondary | ICD-10-CM | POA: Diagnosis not present

## 2020-03-02 DIAGNOSIS — M25462 Effusion, left knee: Secondary | ICD-10-CM | POA: Diagnosis not present

## 2020-03-03 DIAGNOSIS — Z7409 Other reduced mobility: Secondary | ICD-10-CM | POA: Diagnosis not present

## 2020-03-03 DIAGNOSIS — Z96652 Presence of left artificial knee joint: Secondary | ICD-10-CM | POA: Diagnosis not present

## 2020-03-03 DIAGNOSIS — M25462 Effusion, left knee: Secondary | ICD-10-CM | POA: Diagnosis not present

## 2020-03-03 DIAGNOSIS — M25662 Stiffness of left knee, not elsewhere classified: Secondary | ICD-10-CM | POA: Diagnosis not present

## 2020-03-03 DIAGNOSIS — M25562 Pain in left knee: Secondary | ICD-10-CM | POA: Diagnosis not present

## 2020-03-04 DIAGNOSIS — M25462 Effusion, left knee: Secondary | ICD-10-CM | POA: Diagnosis not present

## 2020-03-04 DIAGNOSIS — M25562 Pain in left knee: Secondary | ICD-10-CM | POA: Diagnosis not present

## 2020-03-04 DIAGNOSIS — Z96652 Presence of left artificial knee joint: Secondary | ICD-10-CM | POA: Diagnosis not present

## 2020-03-04 DIAGNOSIS — M25662 Stiffness of left knee, not elsewhere classified: Secondary | ICD-10-CM | POA: Diagnosis not present

## 2020-03-04 DIAGNOSIS — Z7409 Other reduced mobility: Secondary | ICD-10-CM | POA: Diagnosis not present

## 2020-03-08 DIAGNOSIS — M25562 Pain in left knee: Secondary | ICD-10-CM | POA: Diagnosis not present

## 2020-03-08 DIAGNOSIS — Z96652 Presence of left artificial knee joint: Secondary | ICD-10-CM | POA: Diagnosis not present

## 2020-03-08 DIAGNOSIS — M25462 Effusion, left knee: Secondary | ICD-10-CM | POA: Diagnosis not present

## 2020-03-08 DIAGNOSIS — Z7409 Other reduced mobility: Secondary | ICD-10-CM | POA: Diagnosis not present

## 2020-03-08 DIAGNOSIS — M25662 Stiffness of left knee, not elsewhere classified: Secondary | ICD-10-CM | POA: Diagnosis not present

## 2020-03-10 ENCOUNTER — Other Ambulatory Visit: Payer: Self-pay

## 2020-03-10 ENCOUNTER — Ambulatory Visit: Payer: PPO

## 2020-03-10 ENCOUNTER — Ambulatory Visit (INDEPENDENT_AMBULATORY_CARE_PROVIDER_SITE_OTHER): Payer: PPO | Admitting: General Practice

## 2020-03-10 DIAGNOSIS — M25562 Pain in left knee: Secondary | ICD-10-CM | POA: Diagnosis not present

## 2020-03-10 DIAGNOSIS — M25662 Stiffness of left knee, not elsewhere classified: Secondary | ICD-10-CM | POA: Diagnosis not present

## 2020-03-10 DIAGNOSIS — M25462 Effusion, left knee: Secondary | ICD-10-CM | POA: Diagnosis not present

## 2020-03-10 DIAGNOSIS — Z7409 Other reduced mobility: Secondary | ICD-10-CM | POA: Diagnosis not present

## 2020-03-10 DIAGNOSIS — Z96652 Presence of left artificial knee joint: Secondary | ICD-10-CM | POA: Diagnosis not present

## 2020-03-10 DIAGNOSIS — Z7901 Long term (current) use of anticoagulants: Secondary | ICD-10-CM | POA: Diagnosis not present

## 2020-03-10 LAB — POCT INR: INR: 2.4 (ref 2.0–3.0)

## 2020-03-10 NOTE — Patient Instructions (Addendum)
Pre visit review using our clinic review tool, if applicable. No additional management support is needed unless otherwise documented below in the visit note.  Continue to take 1 (6 mg) tablet daily.  Re-check in 4 weeks.

## 2020-03-10 NOTE — Progress Notes (Signed)
I have reviewed the patient's encounter and agree with the documentation.  Algis Greenhouse. Jerline Pain, MD 03/10/2020 9:11 AM

## 2020-03-12 DIAGNOSIS — Z7409 Other reduced mobility: Secondary | ICD-10-CM | POA: Diagnosis not present

## 2020-03-12 DIAGNOSIS — Z96652 Presence of left artificial knee joint: Secondary | ICD-10-CM | POA: Diagnosis not present

## 2020-03-12 DIAGNOSIS — Z8546 Personal history of malignant neoplasm of prostate: Secondary | ICD-10-CM | POA: Diagnosis not present

## 2020-03-12 DIAGNOSIS — M25662 Stiffness of left knee, not elsewhere classified: Secondary | ICD-10-CM | POA: Diagnosis not present

## 2020-03-12 DIAGNOSIS — M25562 Pain in left knee: Secondary | ICD-10-CM | POA: Diagnosis not present

## 2020-03-12 DIAGNOSIS — M25462 Effusion, left knee: Secondary | ICD-10-CM | POA: Diagnosis not present

## 2020-03-12 LAB — PSA: PSA: 0.11

## 2020-03-16 DIAGNOSIS — Z96652 Presence of left artificial knee joint: Secondary | ICD-10-CM | POA: Diagnosis not present

## 2020-03-16 DIAGNOSIS — Z7409 Other reduced mobility: Secondary | ICD-10-CM | POA: Diagnosis not present

## 2020-03-16 DIAGNOSIS — M25562 Pain in left knee: Secondary | ICD-10-CM | POA: Diagnosis not present

## 2020-03-16 DIAGNOSIS — M25462 Effusion, left knee: Secondary | ICD-10-CM | POA: Diagnosis not present

## 2020-03-16 DIAGNOSIS — M25662 Stiffness of left knee, not elsewhere classified: Secondary | ICD-10-CM | POA: Diagnosis not present

## 2020-03-19 DIAGNOSIS — Z96652 Presence of left artificial knee joint: Secondary | ICD-10-CM | POA: Diagnosis not present

## 2020-03-19 DIAGNOSIS — M25562 Pain in left knee: Secondary | ICD-10-CM | POA: Diagnosis not present

## 2020-03-19 DIAGNOSIS — Z7409 Other reduced mobility: Secondary | ICD-10-CM | POA: Diagnosis not present

## 2020-03-19 DIAGNOSIS — M25462 Effusion, left knee: Secondary | ICD-10-CM | POA: Diagnosis not present

## 2020-03-19 DIAGNOSIS — M25662 Stiffness of left knee, not elsewhere classified: Secondary | ICD-10-CM | POA: Diagnosis not present

## 2020-03-22 ENCOUNTER — Encounter: Payer: Self-pay | Admitting: Family Medicine

## 2020-03-23 ENCOUNTER — Other Ambulatory Visit: Payer: Self-pay

## 2020-03-23 MED ORDER — AMLODIPINE BESYLATE 10 MG PO TABS: 10.0000 mg | ORAL_TABLET | ORAL | 1 refills | Status: DC

## 2020-03-26 DIAGNOSIS — M25662 Stiffness of left knee, not elsewhere classified: Secondary | ICD-10-CM | POA: Diagnosis not present

## 2020-03-26 DIAGNOSIS — M25562 Pain in left knee: Secondary | ICD-10-CM | POA: Diagnosis not present

## 2020-03-26 DIAGNOSIS — Z96652 Presence of left artificial knee joint: Secondary | ICD-10-CM | POA: Diagnosis not present

## 2020-03-26 DIAGNOSIS — Z7409 Other reduced mobility: Secondary | ICD-10-CM | POA: Diagnosis not present

## 2020-03-26 DIAGNOSIS — M25462 Effusion, left knee: Secondary | ICD-10-CM | POA: Diagnosis not present

## 2020-04-02 DIAGNOSIS — M25562 Pain in left knee: Secondary | ICD-10-CM | POA: Diagnosis not present

## 2020-04-02 DIAGNOSIS — Z7409 Other reduced mobility: Secondary | ICD-10-CM | POA: Diagnosis not present

## 2020-04-02 DIAGNOSIS — M25462 Effusion, left knee: Secondary | ICD-10-CM | POA: Diagnosis not present

## 2020-04-02 DIAGNOSIS — Z96652 Presence of left artificial knee joint: Secondary | ICD-10-CM | POA: Diagnosis not present

## 2020-04-02 DIAGNOSIS — M25662 Stiffness of left knee, not elsewhere classified: Secondary | ICD-10-CM | POA: Diagnosis not present

## 2020-04-07 ENCOUNTER — Ambulatory Visit (INDEPENDENT_AMBULATORY_CARE_PROVIDER_SITE_OTHER): Payer: PPO | Admitting: General Practice

## 2020-04-07 ENCOUNTER — Other Ambulatory Visit: Payer: Self-pay

## 2020-04-07 DIAGNOSIS — Z7901 Long term (current) use of anticoagulants: Secondary | ICD-10-CM | POA: Diagnosis not present

## 2020-04-07 LAB — POCT INR: INR: 2.5 (ref 2.0–3.0)

## 2020-04-07 NOTE — Patient Instructions (Addendum)
Pre visit review using our clinic review tool, if applicable. No additional management support is needed unless otherwise documented below in the visit note.  Continue to take 1 (6 mg) tablet daily.  Re-check in 6 weeks.

## 2020-04-07 NOTE — Progress Notes (Signed)
I have reviewed the patient's encounter and agree with the documentation.  Arthur King. Jerline Pain, MD 04/07/2020 8:39 AM

## 2020-04-09 DIAGNOSIS — Z96652 Presence of left artificial knee joint: Secondary | ICD-10-CM | POA: Diagnosis not present

## 2020-04-21 DIAGNOSIS — M25462 Effusion, left knee: Secondary | ICD-10-CM | POA: Diagnosis not present

## 2020-04-21 DIAGNOSIS — M25562 Pain in left knee: Secondary | ICD-10-CM | POA: Diagnosis not present

## 2020-04-21 DIAGNOSIS — M25662 Stiffness of left knee, not elsewhere classified: Secondary | ICD-10-CM | POA: Diagnosis not present

## 2020-04-21 DIAGNOSIS — Z7409 Other reduced mobility: Secondary | ICD-10-CM | POA: Diagnosis not present

## 2020-04-21 DIAGNOSIS — Z96652 Presence of left artificial knee joint: Secondary | ICD-10-CM | POA: Diagnosis not present

## 2020-04-30 ENCOUNTER — Encounter: Payer: Self-pay | Admitting: Family Medicine

## 2020-04-30 ENCOUNTER — Telehealth: Payer: Self-pay

## 2020-04-30 NOTE — Telephone Encounter (Signed)
[facility] Patient Name: Arthur King Gender: Male DOB: January 12, 1951 Age: 70 Y 25 M 20 D Return Phone Number: [phone] (Primary) Address: City/ State/ Zip: [place]  [address] Client [facility] [place] [facility] Dimas Chyle- MD Contact Type Call Who Is Calling Patient / Member / Family / Caregiver Call Type Triage / Clinical Relationship To Patient Self Return Phone Number [phone] (Primary) Chief Complaint Leg Pain Reason for Call Symptomatic / Request for [name] says that he has leg pain and swelling Translation No Nurse Assessment Nurse: Velta Addison, RN, [place] Date/Time (Eastern Time): 04/30/2020 12:03:10 PM Confirm and document reason for call. If symptomatic, describe symptoms. ---Caller says that he has leg pain and swelling. Correction: left foot, stated 2-3 days ago, same leg he had knee replacement on. Having difficulty walking, cannot roll his foot, has to 'plop' his foot down. Swelling, pain is about an inch behind the toes. When comparison of right foot you can tell there is a difference. No fever Does the patient have any new or worsening symptoms? ---Yes Will a triage be completed? ---Yes Related visit to physician within the last 2 weeks? ---No Does the PT have any chronic conditions? (i.e. diabetes, asthma, this includes High risk factors for pregnancy, etc.) ---Yes List chronic conditions. ---Protein S Deficiency; Blood Clots; Veins in legs are deteriorated so they leak Is this a behavioral health or substance abuse call? ---No Guidelines Guideline Title Affirmed Question Affirmed Notes Nurse Date/Time Eilene Ghazi Time) Foot Pain [1] Swollen foot AND [2] no fever (Exceptions: localized bump from [place], [place], [place] 04/30/2020  12:06:05 PM PLEASE NOTE: All timestamps contained within this report are represented as [place] Standard Time. CONFIDENTIALTY NOTICE: This fax transmission is intended only for the addressee. It contains information that is legally privileged, confidential or otherwise protected from use or disclosure. If you are not the intended recipient, you are strictly prohibited from reviewing, disclosing, copying using or disseminating any of this information or taking any action in reliance on or regarding this information. If you have received this fax in error, please notify us immediately by telephone so that we can arrange for its return to [place]. Phone: [phone], Toll-Free: [phone], Fax: [phone] Page: 2 of 2 Call Id: [id number] Guidelines Guideline Title Affirmed Question Affirmed Notes Nurse Date/Time Eilene Ghazi Time) bunions, calluses, insect bite, sting) Disp. Time Eilene Ghazi Time) Disposition Final User 04/30/2020 12:11:06 PM See PCP within 24 Hours Yes Parrott, RN, Interior and spatial designer Understands Yes PreDisposition Call Doctor Care Advice Given Per Guideline SEE PCP WITHIN 24 HOURS: * IF OFFICE WILL BE OPEN: You need to be examined within the next 24 hours. Call your doctor (or NP/PA) when the office opens and make an appointment. PAIN MEDICINES: CARE ADVICE given per Foot Pain (Adult) guideline. * You become worse * Fever occurs CALL BACK IF: Comments User: Hamilton Capri, RN Date/Time (Eastern Time): 04/30/2020 12:09:49 PM last INR was two weeks ago and it was good per caller. Has it drawn at office. User: Hamilton Capri, RN Date/Time Eilene Ghazi Time): 04/30/2020 12:16:20 PM Office does not have any appt's available. Advised to go to UC today while he was out to have a timely assessment due to his PMHx. Verbalized understanding. Referrals REFERRED TO PCP OFFICE

## 2020-05-05 ENCOUNTER — Ambulatory Visit: Payer: PPO | Admitting: Internal Medicine

## 2020-05-07 DIAGNOSIS — Z7409 Other reduced mobility: Secondary | ICD-10-CM | POA: Diagnosis not present

## 2020-05-07 DIAGNOSIS — M25662 Stiffness of left knee, not elsewhere classified: Secondary | ICD-10-CM | POA: Diagnosis not present

## 2020-05-07 DIAGNOSIS — M25562 Pain in left knee: Secondary | ICD-10-CM | POA: Diagnosis not present

## 2020-05-07 DIAGNOSIS — Z96652 Presence of left artificial knee joint: Secondary | ICD-10-CM | POA: Diagnosis not present

## 2020-05-07 DIAGNOSIS — M25462 Effusion, left knee: Secondary | ICD-10-CM | POA: Diagnosis not present

## 2020-05-19 ENCOUNTER — Other Ambulatory Visit: Payer: Self-pay

## 2020-05-19 ENCOUNTER — Ambulatory Visit (INDEPENDENT_AMBULATORY_CARE_PROVIDER_SITE_OTHER): Payer: PPO | Admitting: General Practice

## 2020-05-19 DIAGNOSIS — Z7901 Long term (current) use of anticoagulants: Secondary | ICD-10-CM

## 2020-05-19 LAB — POCT INR: INR: 3.4 — AB (ref 2.0–3.0)

## 2020-05-19 NOTE — Progress Notes (Signed)
I have reviewed the patient's encounter and agree with the documentation.  Arthur King. Jerline Pain, MD 05/19/2020 8:08 AM

## 2020-05-19 NOTE — Patient Instructions (Addendum)
Pre visit review using our clinic review tool, if applicable. No additional management support is needed unless otherwise documented below in the visit note.  Skip dosage today and then continue to take 1 (6 mg) tablet daily.  Re-check in 4 weeks.

## 2020-05-20 ENCOUNTER — Other Ambulatory Visit: Payer: Self-pay | Admitting: Neurosurgery

## 2020-05-20 DIAGNOSIS — M545 Low back pain, unspecified: Secondary | ICD-10-CM | POA: Diagnosis not present

## 2020-05-20 DIAGNOSIS — G8929 Other chronic pain: Secondary | ICD-10-CM

## 2020-06-03 ENCOUNTER — Other Ambulatory Visit: Payer: Self-pay

## 2020-06-03 ENCOUNTER — Ambulatory Visit
Admission: RE | Admit: 2020-06-03 | Discharge: 2020-06-03 | Disposition: A | Discharge status: Home / Self Care | Payer: PPO | Source: Ambulatory Visit | Attending: Neurosurgery | Admitting: Neurosurgery

## 2020-06-03 DIAGNOSIS — G8929 Other chronic pain: Secondary | ICD-10-CM

## 2020-06-03 DIAGNOSIS — M545 Low back pain, unspecified: Secondary | ICD-10-CM

## 2020-06-03 DIAGNOSIS — M48061 Spinal stenosis, lumbar region without neurogenic claudication: Secondary | ICD-10-CM | POA: Diagnosis not present

## 2020-06-10 DIAGNOSIS — M545 Low back pain, unspecified: Secondary | ICD-10-CM | POA: Diagnosis not present

## 2020-06-23 ENCOUNTER — Other Ambulatory Visit: Payer: Self-pay

## 2020-06-23 ENCOUNTER — Ambulatory Visit (INDEPENDENT_AMBULATORY_CARE_PROVIDER_SITE_OTHER): Payer: PPO | Admitting: General Practice

## 2020-06-23 DIAGNOSIS — Z7901 Long term (current) use of anticoagulants: Secondary | ICD-10-CM | POA: Diagnosis not present

## 2020-06-23 LAB — POCT INR: INR: 3.4 — AB (ref 2.0–3.0)

## 2020-06-23 NOTE — Progress Notes (Signed)
I have reviewed the patient's encounter and agree with the documentation.  Algis Greenhouse. Jerline Pain, MD 06/23/2020 8:06 AM

## 2020-06-23 NOTE — Patient Instructions (Signed)
Pre visit review using our clinic review tool, if applicable. No additional management support is needed unless otherwise documented below in the visit note.  Skip dosage today and then change dosage and take 1/2 tablet (3 mg) on Wednesdays.    Re-check in 4 weeks.

## 2020-06-25 ENCOUNTER — Ambulatory Visit: Payer: PPO | Admitting: Internal Medicine

## 2020-07-21 ENCOUNTER — Ambulatory Visit (INDEPENDENT_AMBULATORY_CARE_PROVIDER_SITE_OTHER): Payer: PPO | Admitting: General Practice

## 2020-07-21 ENCOUNTER — Other Ambulatory Visit: Payer: Self-pay | Admitting: General Practice

## 2020-07-21 ENCOUNTER — Other Ambulatory Visit: Payer: Self-pay

## 2020-07-21 DIAGNOSIS — Z7901 Long term (current) use of anticoagulants: Secondary | ICD-10-CM

## 2020-07-21 LAB — POCT INR: INR: 2.7 (ref 2.0–3.0)

## 2020-07-21 MED ORDER — WARFARIN SODIUM 6 MG PO TABS
ORAL_TABLET | ORAL | 1 refills | Status: DC
Start: 2020-07-21 — End: 2021-01-26

## 2020-07-21 NOTE — Patient Instructions (Addendum)
Pre visit review using our clinic review tool, if applicable. No additional management support is needed unless otherwise documented below in the visit note.  Continue to take 1/2 tablet (3 mg) on Wednesdays and 1 tablet (6 mg) all other days.   Re-check in 4 to 6 weeks.

## 2020-07-21 NOTE — Progress Notes (Signed)
I have reviewed the patient's encounter and agree with the documentation.  Algis Greenhouse. Jerline Pain, MD 07/21/2020 8:15 AM

## 2020-07-21 NOTE — Progress Notes (Signed)
I have reviewed the patient's encounter and agree with the documentation.  Algis Greenhouse. Jerline Pain, MD 07/21/2020 8:59 AM

## 2020-09-01 ENCOUNTER — Ambulatory Visit: Payer: PPO

## 2020-09-08 ENCOUNTER — Ambulatory Visit: Payer: PPO

## 2020-09-15 ENCOUNTER — Other Ambulatory Visit: Payer: Self-pay

## 2020-09-15 ENCOUNTER — Ambulatory Visit (INDEPENDENT_AMBULATORY_CARE_PROVIDER_SITE_OTHER): Payer: PPO

## 2020-09-15 DIAGNOSIS — Z7901 Long term (current) use of anticoagulants: Secondary | ICD-10-CM

## 2020-09-15 LAB — POCT INR: INR: 2.8 (ref 2.0–3.0)

## 2020-09-15 NOTE — Patient Instructions (Addendum)
Pre visit review using our clinic review tool, if applicable. No additional management support is needed unless otherwise documented below in the visit note.  Continue to take 1/2 tablet (3 mg) on Wednesdays and 1 tablet (6 mg) all other days.   Re-check in 7 weeks.

## 2020-09-15 NOTE — Progress Notes (Signed)
I have reviewed the patient's encounter and agree with the documentation.  Algis Greenhouse. Jerline Pain, MD 09/15/2020 9:09 AM

## 2020-09-29 DIAGNOSIS — Z8546 Personal history of malignant neoplasm of prostate: Secondary | ICD-10-CM | POA: Diagnosis not present

## 2020-09-29 LAB — PSA: PSA: 0.064

## 2020-09-30 ENCOUNTER — Ambulatory Visit
Admission: RE | Admit: 2020-09-30 | Discharge: 2020-09-30 | Disposition: A | Discharge status: Home / Self Care | Payer: PPO | Source: Ambulatory Visit | Attending: Emergency Medicine | Admitting: Emergency Medicine

## 2020-09-30 ENCOUNTER — Ambulatory Visit (HOSPITAL_BASED_OUTPATIENT_CLINIC_OR_DEPARTMENT_OTHER)
Admission: RE | Admit: 2020-09-30 | Discharge: 2020-09-30 | Disposition: A | Discharge status: Home / Self Care | Payer: PPO | Source: Ambulatory Visit | Attending: Emergency Medicine | Admitting: Emergency Medicine

## 2020-09-30 ENCOUNTER — Other Ambulatory Visit: Payer: Self-pay

## 2020-09-30 VITALS — BP 150/77 | HR 69 | Temp 98.6°F | Resp 16

## 2020-09-30 DIAGNOSIS — R0602 Shortness of breath: Secondary | ICD-10-CM

## 2020-09-30 DIAGNOSIS — R062 Wheezing: Secondary | ICD-10-CM | POA: Insufficient documentation

## 2020-09-30 DIAGNOSIS — R059 Cough, unspecified: Secondary | ICD-10-CM | POA: Diagnosis not present

## 2020-09-30 MED ORDER — ALBUTEROL SULFATE HFA 108 (90 BASE) MCG/ACT IN AERS: 1.0000 | INHALATION_SPRAY | Freq: Four times a day (QID) | RESPIRATORY_TRACT | 0 refills | Status: AC | PRN

## 2020-09-30 MED ORDER — PREDNISONE 10 MG (21) PO TBPK
ORAL_TABLET | Freq: Every day | ORAL | 0 refills | Status: DC
Start: 2020-09-30 — End: 2020-10-01

## 2020-09-30 MED ORDER — ALBUTEROL SULFATE (2.5 MG/3ML) 0.083% IN NEBU: 2.5000 mg | INHALATION_SOLUTION | Freq: Four times a day (QID) | RESPIRATORY_TRACT | 12 refills | Status: DC | PRN

## 2020-09-30 NOTE — ED Triage Notes (Signed)
Patient presents to Urgent Care with complaints of cough and nasal congestion x 5 weeks. He states over the past 3-4 days he has been coughing up greenish mucous. Not treating symptoms. Has a hx of allergies.   Denies fever, SOB, or fever.

## 2020-09-30 NOTE — ED Provider Notes (Signed)
[facility]    CSN: [id number] Arrival date & time: 09/30/20  1205      History   Chief Complaint Chief Complaint  Patient presents with   Cough   Nasal Congestion    HPI Arthur King is a 70 y.o. male.   Pt here for cough, sob for 5 weeks now. Getting worse and now has green phelm he is coughing. Denies any fevers, no chest pain, no leg swelling. Has been using an inhaler at home he has with minimal relief. 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 Authorizing Provider  albuterol (PROVENTIL) (2.5 MG/3ML) 0.083% nebulizer solution Take 3 mLs (2.5 mg total) by nebulization every 6 (six) hours as needed for wheezing or shortness of breath. 09/30/20  Yes Marney Setting, NP  albuterol (VENTOLIN HFA) 108 (90 Base) MCG/ACT inhaler Inhale 1-2 puffs into the lungs every 6 (six) hours as needed for wheezing or shortness of  breath. 09/30/20  Yes Marney Setting, NP  predniSONE (STERAPRED UNI-PAK 21 TAB) 10 MG (21) TBPK tablet Take by mouth daily. Take 6 tabs by mouth daily  for 2 days, then 5 tabs for 2 days, then 4 tabs for 2 days, then 3 tabs for 2 days, 2 tabs for 2 days, then 1 tab by mouth daily for 2 days 09/30/20  Yes Morley Kos L, NP  amLODipine (NORVASC) 10 MG tablet Take 1 tablet (10 mg total) by mouth every morning. 03/23/20 06/21/20  Vivi Barrack, MD  lisinopril-hydrochlorothiazide (ZESTORETIC) 20-25 MG tablet Take 1 tablet by mouth every morning.    [provider]  Multiple Vitamins-Minerals (CENTRUM SILVER 50+MEN) TABS Take 1 tablet by mouth daily.    [provider]  warfarin (COUMADIN) 6 MG tablet Take 1 tablet daily or Take as directed by anticoagulation clinic 07/21/20   Vivi Barrack, MD    Family History History reviewed. No pertinent family history.  Social History Social History   Tobacco Use   Smoking status: Never   Smokeless tobacco: Never  Vaping Use   Vaping Use: Never used  Substance Use Topics   Alcohol use: Never   Drug use: Never     Allergies  Neosporin [bacitracin-polymyxin b]   Review of Systems Review of Systems  Constitutional:  Negative for activity change and fever.  HENT:  Positive for congestion.   Respiratory:  Positive for cough, shortness of breath and wheezing.   Cardiovascular: Negative.   Gastrointestinal: Negative.   Genitourinary: Negative.   Neurological: Negative.     Physical Exam Triage Vital Signs ED Triage Vitals [09/30/20 1252]  Enc Vitals Group     BP (!) 150/77     Pulse Rate 69     Resp 16     Temp 98.6 F (37 C)     Temp Source Oral     SpO2 96 %     Weight      Height      Head Circumference      Peak Flow      Pain Score 0     Pain Loc      Pain Edu?      Excl. in [place]?    No data found.  Updated Vital Signs BP (!) 150/77 (BP Location: Right Arm)   Pulse 69   Temp 98.6 F (37 C) (Oral)    Resp 16   SpO2 96%   Visual Acuity Right Eye Distance:   Left Eye Distance:   Bilateral Distance:    Right Eye Near:   Left Eye Near:    Bilateral Near:     Physical Exam Constitutional:      Appearance: He is obese.  HENT:     Nose: Congestion present.  Eyes:     Pupils: Pupils are equal, round, and reactive to light.  Cardiovascular:     Rate and Rhythm: Normal rate.  Pulmonary:     Breath sounds: Wheezing and rhonchi present.  Abdominal:     General: Abdomen is flat.  Musculoskeletal:        General: Normal range of motion.     Cervical back: Normal range of motion.  Skin:    General: Skin is warm.  Neurological:     General: No focal deficit present.     Mental Status: He is alert.     UC Treatments / Results  Labs (all labs ordered are listed, but only abnormal results are displayed) Labs Reviewed - No data to display  EKG   Radiology DG Chest 2 View  Result Date: 09/30/2020 CLINICAL DATA:  Cough and wheezing. EXAM: CHEST - 2 VIEW COMPARISON:  None. FINDINGS: The heart size and mediastinal contours are within normal limits. Eventration of the right hemidiaphragm. Both lungs are clear. The visualized skeletal structures are unremarkable. IMPRESSION: No active cardiopulmonary disease. Electronically Signed   By: Kerby Moors M.D.   On: 09/30/2020 14:11    Procedures Procedures (including critical care time)  Medications Ordered in UC Medications - No data to display  Initial Impression / Assessment and Plan / UC Course  I have reviewed the triage vital signs and the nursing notes.  Pertinent labs & imaging results that were available during my care of the patient were reviewed by me and considered in my medical decision making (see chart for details).     We are sending you for a chest xray to r/o pneumonia once we receive results we will call you and place in my chart  Use inhaler as needed for sob  If symptoms become worse you will need to go  to the ER  Cont to use your cpap machine  Called pt of results negative  Final Clinical Impressions(s) / UC Diagnoses   Final diagnoses:  Cough  Wheezing  Shortness of breath     Discharge Instructions      We are sending you for a chest xray to r/o pneumonia once we receive results we will call you and place in my chart        ED Prescriptions     Medication Sig Dispense Auth. Provider   albuterol (VENTOLIN HFA) 108 (90 Base) MCG/ACT inhaler Inhale 1-2 puffs into the lungs every 6 (six) hours as needed for wheezing or shortness of breath. 90 g Morley Kos L, NP   predniSONE (STERAPRED UNI-PAK 21 TAB) 10 MG (21) TBPK tablet Take by mouth daily. Take 6 tabs by mouth daily  for 2 days, then 5 tabs for 2 days, then 4 tabs for 2 days, then 3 tabs for 2 days, 2 tabs for 2 days, then 1 tab by mouth daily for 2 days 42 tablet Morley Kos L, NP   albuterol (PROVENTIL) (2.5 MG/3ML) 0.083% nebulizer solution Take 3 mLs (2.5 mg total) by nebulization every 6 (six) hours as needed for wheezing or shortness of breath. 75 mL Marney Setting, NP      PDMP not reviewed this encounter.   Marney Setting, NP 09/30/20 1422

## 2020-09-30 NOTE — Discharge Instructions (Addendum)
We are sending you for a chest xray to r/o pneumonia once we receive results we will call you and place in my chart

## 2020-10-01 ENCOUNTER — Telehealth: Payer: Self-pay | Admitting: Emergency Medicine

## 2020-10-01 MED ORDER — PREDNISONE 10 MG (21) PO TBPK: ORAL_TABLET | Freq: Every day | ORAL | 0 refills | Status: DC

## 2020-10-01 MED ORDER — ALBUTEROL SULFATE (2.5 MG/3ML) 0.083% IN NEBU: 2.5000 mg | INHALATION_SOLUTION | Freq: Four times a day (QID) | RESPIRATORY_TRACT | 12 refills | Status: AC | PRN

## 2020-10-06 DIAGNOSIS — Z8546 Personal history of malignant neoplasm of prostate: Secondary | ICD-10-CM | POA: Diagnosis not present

## 2020-10-06 DIAGNOSIS — R3915 Urgency of urination: Secondary | ICD-10-CM | POA: Diagnosis not present

## 2020-10-07 ENCOUNTER — Encounter: Payer: Self-pay | Admitting: Family Medicine

## 2020-10-12 ENCOUNTER — Other Ambulatory Visit: Payer: Self-pay | Admitting: Family Medicine

## 2020-10-12 ENCOUNTER — Other Ambulatory Visit: Payer: Self-pay

## 2020-10-12 ENCOUNTER — Ambulatory Visit
Admission: RE | Admit: 2020-10-12 | Discharge: 2020-10-12 | Disposition: A | Discharge status: Home / Self Care | Payer: PPO | Source: Ambulatory Visit | Attending: Emergency Medicine | Admitting: Emergency Medicine

## 2020-10-12 VITALS — BP 143/78 | HR 60 | Temp 97.8°F | Resp 20

## 2020-10-12 DIAGNOSIS — J019 Acute sinusitis, unspecified: Secondary | ICD-10-CM

## 2020-10-12 MED ORDER — AMOXICILLIN-POT CLAVULANATE 875-125 MG PO TABS: 1.0000 | ORAL_TABLET | Freq: Two times a day (BID) | ORAL | 0 refills | Status: AC

## 2020-10-12 NOTE — ED Provider Notes (Signed)
[facility]    CSN: [id number] Arrival date & time: 10/12/20  2409      History   Chief Complaint Chief Complaint  Patient presents with   Facial Pain   Nasal Congestion    HPI Arthur King is a 70 y.o. male history of prostate cancer presenting today for evaluation of URI symptoms.  Reports associated cough, rhinorrhea and associated fevers.  Patient was seen here approximately 1.5 weeks ago with negative chest x-ray and treated with prednisone course and albuterol inhaler.  Symptoms have continued/moved to sinuses where he reports significant thick discolored congestion.  Associated dental pain, subjective fevers and intermittent chills.  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 Authorizing Provider  amoxicillin-clavulanate (AUGMENTIN) 875-125 MG tablet Take 1 tablet by mouth every 12 (twelve) hours for 7 days. 10/12/20 10/19/20 Yes Melizza Kanode,  Nuriyah Hanline C, PA-C  albuterol (PROVENTIL) (2.5 MG/3ML) 0.083% nebulizer solution Take 3 mLs (2.5 mg total) by nebulization every 6 (six) hours as needed for wheezing or shortness of breath. 10/01/20   Marney Setting, NP  albuterol (VENTOLIN HFA) 108 (90 Base) MCG/ACT inhaler Inhale 1-2 puffs into the lungs every 6 (six) hours as needed for wheezing or shortness of breath. 09/30/20   Marney Setting, NP  amLODipine (NORVASC) 10 MG tablet Take 1 tablet (10 mg total) by mouth every morning. 03/23/20 06/21/20  Vivi Barrack, MD  lisinopril-hydrochlorothiazide (ZESTORETIC) 20-25 MG tablet Take 1 tablet by mouth every morning.    [provider]  Multiple Vitamins-Minerals (CENTRUM SILVER 50+MEN) TABS Take 1 tablet by mouth daily.    [provider]  predniSONE (STERAPRED UNI-PAK 21 TAB) 10 MG (21) TBPK tablet Take by mouth daily. Take 6 tabs by mouth daily  for 2 days, then 5 tabs for 2 days, then 4 tabs for 2 days, then 3 tabs for 2 days, 2 tabs for 2 days, then 1 tab by mouth daily for 2 days 10/01/20   Marney Setting, NP  warfarin (COUMADIN) 6 MG tablet Take 1 tablet daily or Take as directed by anticoagulation clinic 07/21/20   Vivi Barrack, MD    Family History History reviewed. No pertinent family history.  Social History Social History  Tobacco Use   Smoking status: Never   Smokeless tobacco: Never  Vaping Use   Vaping Use: Never used  Substance Use Topics   Alcohol use: Never   Drug use: Never     Allergies   Neosporin [bacitracin-polymyxin b]   Review of Systems Review of Systems  Constitutional:  Positive for fever. Negative for activity change, appetite change, chills and fatigue.  HENT:  Positive for congestion, rhinorrhea and sinus pressure. Negative for ear pain, sore throat and trouble swallowing.   Eyes:  Negative for discharge and redness.  Respiratory:  Positive for cough. Negative for chest tightness and shortness of breath.   Cardiovascular:   Negative for chest pain.  Gastrointestinal:  Negative for abdominal pain, diarrhea, nausea and vomiting.  Musculoskeletal:  Negative for myalgias.  Skin:  Negative for rash.  Neurological:  Negative for dizziness, light-headedness and headaches.    Physical Exam Triage Vital Signs ED Triage Vitals  Enc Vitals Group     BP      Pulse      Resp      Temp      Temp src      SpO2      Weight      Height      Head Circumference      Peak Flow      Pain Score      Pain Loc      Pain Edu?      Excl. in [place]?    No data found.  Updated Vital Signs BP (!) 143/78 (BP Location: Right Arm)   Pulse 60   Temp 97.8 F (36.6 C) (Oral)   Resp 20   SpO2 97%   Visual Acuity Right Eye Distance:   Left Eye Distance:   Bilateral Distance:    Right Eye Near:   Left Eye Near:    Bilateral Near:     Physical Exam Vitals and nursing note reviewed.  Constitutional:      Appearance: He is well-developed.     Comments: No acute distress  HENT:     Head: Normocephalic and atraumatic.     Ears:     Comments: Bilateral ears without tenderness to palpation of external auricle, tragus and mastoid, EAC's without erythema or swelling, TM's with good bony landmarks and cone of light. Non erythematous.      Nose: Nose normal.     Mouth/Throat:     Comments: Oral mucosa pink and moist, no tonsillar enlargement or exudate. Posterior pharynx patent and nonerythematous, no uvula deviation or swelling. Normal phonation.  Eyes:     Conjunctiva/sclera: Conjunctivae normal.  Cardiovascular:     Rate and Rhythm: Normal rate.  Pulmonary:     Effort: Pulmonary effort is normal. No respiratory distress.     Comments: Breathing comfortably at rest, CTABL, no wheezing, rales or other adventitious sounds auscultated  Abdominal:     General: There is no distension.  Musculoskeletal:        General: Normal range of motion.     Cervical back: Neck supple.  Skin:    General: Skin is warm and dry.   Neurological:     Mental Status: He is alert and oriented to person, place, and time.     UC Treatments / Results  Labs (all labs ordered are listed, but only abnormal results are displayed) Labs Reviewed - No data to display  EKG   Radiology No results found.  Procedures Procedures (including critical  care time)  Medications Ordered in UC Medications - No data to display  Initial Impression / Assessment and Plan / UC Course  I have reviewed the triage vital signs and the nursing notes.  Pertinent labs & imaging results that were available during my care of the patient were reviewed by me and considered in my medical decision making (see chart for details).     Treating for sinusitis with Augmentin x1 week, will schedule auscultation, recent chest x-ray, will defer further imaging.  Rest and fluids.  Recommended Mucinex and Flonase use, follow-up with ENT for chronic right ear hearing problems.  Discussed strict return precautions. Patient verbalized understanding and is agreeable with plan.  Final Clinical Impressions(s) / UC Diagnoses   Final diagnoses:  Acute sinusitis with symptoms > 10 days     Discharge Instructions      Begin Augmentin twice daily x1 week-take with food May use over-the-counter Mucinex, Flonase to further help with congestion, mucus Drink plenty of fluids Follow-up if not improving or worsening     ED Prescriptions     Medication Sig Dispense Auth. Provider   amoxicillin-clavulanate (AUGMENTIN) 875-125 MG tablet Take 1 tablet by mouth every 12 (twelve) hours for 7 days. 14 tablet Brent Taillon, [place] C, PA-C      PDMP not reviewed this encounter.   Janith Lima, [place] 10/12/20 [phone]

## 2020-10-12 NOTE — ED Triage Notes (Signed)
Pt reports for about two months he has been having some chest congestion, sinus pressure, and dental pain (states on prevous visit he was prescribed steroid but symptoms never resolved).

## 2020-10-12 NOTE — Discharge Instructions (Signed)
Begin Augmentin twice daily x1 week-take with food May use over-the-counter Mucinex, Flonase to further help with congestion, mucus Drink plenty of fluids Follow-up if not improving or worsening

## 2020-10-19 ENCOUNTER — Ambulatory Visit: Payer: PPO | Admitting: Family Medicine

## 2020-11-03 ENCOUNTER — Other Ambulatory Visit: Payer: Self-pay

## 2020-11-03 ENCOUNTER — Ambulatory Visit (INDEPENDENT_AMBULATORY_CARE_PROVIDER_SITE_OTHER): Payer: PPO

## 2020-11-03 DIAGNOSIS — Z7901 Long term (current) use of anticoagulants: Secondary | ICD-10-CM

## 2020-11-03 LAB — POCT INR: INR: 3 (ref 2.0–3.0)

## 2020-11-03 NOTE — Patient Instructions (Addendum)
Pre visit review using our clinic review tool, if applicable. No additional management support is needed unless otherwise documented below in the visit note.  Continue to take 1/2 tablet (3 mg) on Wednesdays and 1 tablet (6 mg) all other days.   Re-check in 6 week.

## 2020-11-03 NOTE — Progress Notes (Signed)
I have reviewed the patient's encounter and agree with the documentation.  Algis Greenhouse. Jerline Pain, MD 11/03/2020 8:59 AM

## 2020-12-15 ENCOUNTER — Ambulatory Visit (INDEPENDENT_AMBULATORY_CARE_PROVIDER_SITE_OTHER): Payer: PPO

## 2020-12-15 ENCOUNTER — Other Ambulatory Visit: Payer: Self-pay

## 2020-12-15 DIAGNOSIS — Z7901 Long term (current) use of anticoagulants: Secondary | ICD-10-CM | POA: Diagnosis not present

## 2020-12-15 LAB — POCT INR: INR: 2.4 (ref 2.0–3.0)

## 2020-12-15 NOTE — Progress Notes (Signed)
I have reviewed the patient's encounter and agree with the documentation.  Algis Greenhouse. Jerline Pain, MD 12/15/2020 9:01 AM

## 2020-12-15 NOTE — Progress Notes (Signed)
Continue to take 1/2 tablet (3 mg) on Wednesdays and 1 tablet (6 mg) all other days.   Re-check in 6 week.

## 2020-12-15 NOTE — Patient Instructions (Addendum)
Pre visit review using our clinic review tool, if applicable. No additional management support is needed unless otherwise documented below in the visit note.  Continue to take 1/2 tablet (3 mg) on Wednesdays and 1 tablet (6 mg) all other days.   Re-check in 6 week.

## 2020-12-27 ENCOUNTER — Encounter: Payer: PPO | Admitting: Family Medicine

## 2021-01-26 ENCOUNTER — Other Ambulatory Visit: Payer: Self-pay

## 2021-01-26 ENCOUNTER — Other Ambulatory Visit: Payer: Self-pay | Admitting: Family Medicine

## 2021-01-26 ENCOUNTER — Ambulatory Visit (INDEPENDENT_AMBULATORY_CARE_PROVIDER_SITE_OTHER): Payer: PPO

## 2021-01-26 DIAGNOSIS — Z7901 Long term (current) use of anticoagulants: Secondary | ICD-10-CM | POA: Diagnosis not present

## 2021-01-26 LAB — POCT INR: INR: 1.8 — AB (ref 2.0–3.0)

## 2021-01-26 NOTE — Patient Instructions (Addendum)
Pre visit review using our clinic review tool, if applicable. No additional management support is needed unless otherwise documented below in the visit note.  Increase dose today to take 1 tablet and then continue to take 1/2 tablet (3 mg) on Wednesdays and 1 tablet (6 mg) all other days.   Re-check in 4 week.

## 2021-01-26 NOTE — Progress Notes (Addendum)
Pt reports about 2 wks ago having a superficial blood clot in R shin. He reported he did not see PCP because he knew what to do. He took aspirin, 325 mg, for two days and an extra 3 mg coumadin. He kept the leg elevated and decreased activity for 5 days and he reports no further problems.   Increase dose today to take 1 tablet and then continue to take 1/2 tablet (3 mg) on Wednesdays and 1 tablet (6 mg) all other days.   Re-check in 4 week.

## 2021-01-26 NOTE — Progress Notes (Signed)
I have reviewed the patient's encounter and agree with the documentation.  Algis Greenhouse. Jerline Pain, MD 01/26/2021 9:57 AM

## 2021-02-03 ENCOUNTER — Other Ambulatory Visit: Payer: Self-pay

## 2021-02-03 ENCOUNTER — Ambulatory Visit (INDEPENDENT_AMBULATORY_CARE_PROVIDER_SITE_OTHER): Payer: PPO | Admitting: Family Medicine

## 2021-02-03 ENCOUNTER — Encounter: Payer: Self-pay | Admitting: Family Medicine

## 2021-02-03 VITALS — BP 146/84 | HR 65 | Temp 98.0°F | Ht 70.0 in | Wt 247.2 lb

## 2021-02-03 DIAGNOSIS — R739 Hyperglycemia, unspecified: Secondary | ICD-10-CM

## 2021-02-03 DIAGNOSIS — Z6835 Body mass index (BMI) 35.0-35.9, adult: Secondary | ICD-10-CM | POA: Diagnosis not present

## 2021-02-03 DIAGNOSIS — D6859 Other primary thrombophilia: Secondary | ICD-10-CM

## 2021-02-03 DIAGNOSIS — I10 Essential (primary) hypertension: Secondary | ICD-10-CM

## 2021-02-03 DIAGNOSIS — M519 Unspecified thoracic, thoracolumbar and lumbosacral intervertebral disc disorder: Secondary | ICD-10-CM | POA: Diagnosis not present

## 2021-02-03 DIAGNOSIS — E669 Obesity, unspecified: Secondary | ICD-10-CM

## 2021-02-03 DIAGNOSIS — Z0001 Encounter for general adult medical examination with abnormal findings: Secondary | ICD-10-CM | POA: Diagnosis not present

## 2021-02-03 DIAGNOSIS — Z13220 Encounter for screening for lipoid disorders: Secondary | ICD-10-CM

## 2021-02-03 DIAGNOSIS — C61 Malignant neoplasm of prostate: Secondary | ICD-10-CM

## 2021-02-03 NOTE — Progress Notes (Signed)
Chief Complaint:  Arthur King is a 71 y.o. male who presents today for his annual comprehensive physical exam.    Assessment/Plan:  Chronic Problems Addressed Today: Lumbar disc disease Still has a fair amount of pain but this is manageable. Will continue management per orthopedics.   Prostate cancer [facility]) Continue management per urology.   Protein S deficiency ([place]) Anticoagulated with warfarin. We discussed switching to DOAC however he feels comfortable where he is for now. He does not wish to make any changes at this point. He will let Arthur King know if he wishes to have a referral to hematology or if he would like to switch medications.   Hyperglycemia Check A1c.   Essential hypertension Above goal today thought typically well controlled. Will continue amlodipine 10mg  daily and lisinopril-HCTZ 29476 once daily.   Body mass index is 35.47 kg/m. / Obese  BMI Metric Follow Up - 02/03/21 1449       BMI Metric Follow Up-Please document annually   BMI Metric Follow Up Education provided              Preventative Healthcare: Would get blood work done today. He would like to hold off colon screening.   Patient Counseling(The following topics were reviewed and/or handout was given):  -Nutrition: Stressed importance of moderation in sodium/caffeine intake, saturated fat and cholesterol, caloric balance, sufficient intake of fresh fruits, vegetables, and fiber.  -Stressed the importance of regular exercise.   -Substance Abuse: Discussed cessation/primary prevention of tobacco, alcohol, or other drug use; driving or other dangerous activities under the influence; availability of treatment for abuse.   -Injury prevention: Discussed safety belts, safety helmets, smoke detector, smoking near bedding or upholstery.   -Sexuality: Discussed sexually transmitted diseases, partner selection, use of condoms, avoidance of unintended pregnancy and contraceptive alternatives.   -Dental  health: Discussed importance of regular tooth brushing, flossing, and dental visits.  -Health maintenance and immunizations reviewed. Please refer to Health maintenance section.  Return to care in 1 year for next preventative visit.     Subjective:  HPI:  He has no acute complaints today.   He still have issue with back pain. He notes he takes Tynelol sometimes to help alleviate the pain. He is following up with Dr Arthur King, Arthur King for this issue. Had workup which showed bulge disc in the back. He is trying to follow a healthy diet and exercise. He also have some swelling and pain in his legs. He wears compression socks and try to elevate his feet. This is manageable.    Lifestyle Diet: Balanced Exercise: None specific.   Depression screen PHQ 2/9 02/16/2020  Decreased Interest 0  Down, Depressed, Hopeless 0  PHQ - 2 Score 0    Health Maintenance Due  Topic Date Due   COVID-19 Vaccine (1) Never done   Hepatitis C Screening  Never done   Zoster Vaccines- Shingrix (1 of 2) Never done   COLONOSCOPY (Pts 45-29yrs Insurance coverage will need to be confirmed)  Never done     ROS: Per HPI, otherwise a complete review of systems was negative.   PMH:  The following were reviewed and entered/updated in epic: Past Medical History:  Diagnosis Date   Arthritis    Childhood asthma    DVT, lower extremity, recurrent, left ([place]) 1974   Hearing loss    Hypertension    Obese    OSA on CPAP    Pre-diabetes    Prostate cancer ([place])    Protein  S deficiency ([place])    Vitamin D deficiency    Patient Active Problem List   Diagnosis Date Noted   Lumbar disc disease 02/03/2021   Long term (current) use of anticoagulants 02/18/2020   Essential hypertension 02/16/2020   Hyperglycemia 02/16/2020   Protein S deficiency ([place]) 02/16/2020   Prostate cancer (Arthur King) 02/16/2020   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  Medications- reviewed and updated Current Outpatient Medications  Medication Sig Dispense Refill   albuterol (PROVENTIL) (2.5 MG/3ML) 0.083% nebulizer solution Take 3 mLs (2.5 mg total) by nebulization every 6 (six) hours as needed for wheezing or shortness of breath. 75 mL 12   albuterol (VENTOLIN HFA) 108 (90 Base) MCG/ACT inhaler Inhale 1-2 puffs into the lungs every 6 (six) hours as needed for wheezing or shortness of breath. 90 g 0   amLODipine (NORVASC) 10 MG tablet TAKE 1 TABLET(10 MG) BY MOUTH EVERY MORNING 90 tablet 1   lisinopril-hydrochlorothiazide (ZESTORETIC) 20-25 MG tablet Take 1 tablet by mouth every morning.     Multiple Vitamins-Minerals (CENTRUM SILVER 50+MEN) TABS Take 1 tablet by mouth daily.     warfarin (COUMADIN) 6 MG tablet TAKE 1 TABLET BY MOUTH DAILY AS DIRECTED BY COAGULATION CLINIC 90 tablet 1   No current facility-administered medications for this visit.    Allergies-reviewed and updated Allergies  Allergen Reactions   Neosporin [Bacitracin-Polymyxin B] Rash    Social History   Socioeconomic History   Marital status: Married    Spouse name: Not on file   Number of children: Not on file   Years of education: Not on file   Highest education level: Not on file  Occupational History   Not on file  Tobacco Use   Smoking status: Never   Smokeless tobacco: Never  Vaping Use   Vaping Use: Never used  Substance and Sexual Activity   Alcohol use: Never   Drug use: Never   Sexual activity: Not on file  Other Topics Concern   Not on file  Social History Narrative   Not on file   Social Determinants of  Health   Financial Resource Strain: Not on file  Food Insecurity: Not on file  Transportation Needs: Not on file  Physical Activity: Not on file  Stress: Not on file  Social Connections: Not on file        Objective:  Physical Exam: BP (!) 146/84    Pulse 65    Temp 98 F (36.7 C) (Temporal)    Ht 5\' 10"  (1.778 m)    Wt 247 lb 3.2 oz (112.1 kg)    SpO2 98%    BMI 35.47 kg/m   Body mass index is 35.47 kg/m. Wt Readings from Last 3 Encounters:  02/03/21 247 lb 3.2 oz (112.1 kg)  02/16/20 254 lb (115.2 kg)  12/29/19 251 lb 15.8 oz (114.3 kg)   Gen: NAD, resting comfortably HEENT: TMs normal bilaterally. OP clear. No thyromegaly noted.  CV: RRR with no  murmurs appreciated Pulm: NWOB, CTAB with no crackles, wheezes, or rhonchi GI: Normal bowel sounds present. Soft, Nontender, Nondistended. MSK: no edema, cyanosis, or clubbing noted Skin: warm, dry Neuro: CN2-12 grossly intact. Strength 5/5 in upper and lower extremities. Reflexes symmetric and intact bilaterally.  Psych: Normal affect and thought content      I,Savera Zaman,acting as a scribe for Dimas Chyle, MD.,have documented all relevant documentation on the behalf of Dimas Chyle, MD,as directed by  Dimas Chyle, MD while in the presence of Dimas Chyle, MD.   I, Dimas Chyle, MD, have reviewed all documentation for this visit. The documentation on 02/03/21 for the exam, diagnosis, procedures, and orders are all accurate and complete.  Algis Greenhouse. Jerline Pain, MD 02/03/2021 2:49 PM

## 2021-02-03 NOTE — Assessment & Plan Note (Signed)
Continue management per urology. 

## 2021-02-03 NOTE — Assessment & Plan Note (Signed)
Check A1c. 

## 2021-02-03 NOTE — Patient Instructions (Addendum)
It was very nice to see you today!  We will check blood work today.  Keep an eye on your blood pressure and let me know if it is persistently 150/90 or higher.   Please let me know when you would like to have your colonoscopy referral.  We will see you back in a year for your next physical. Come back sooner if needed.   Take care, Dr Jerline Pain  PLEASE NOTE:  If you had any lab tests please let us know if you have not heard back within a few days. You may see your results on mychart before we have a chance to review them but we will give you a call once they are reviewed by [place]. If we ordered any referrals today, please let us know if you have not heard from their office within the next week.   Please try these tips to maintain a healthy lifestyle:  Eat at least 3 REAL meals and 1-2 snacks per day.  Aim for no more than 5 hours between eating.  If you eat breakfast, please do so within one hour of getting up.   Each meal should contain half fruits/vegetables, one quarter protein, and one quarter carbs (no bigger than a computer mouse)  Cut down on sweet beverages. This includes juice, soda, and sweet tea.   Drink at least 1 glass of water with each meal and aim for at least 8 glasses per day  Exercise at least 150 minutes every week.    Preventive Care 63 Years and Older, Male Preventive care refers to lifestyle choices and visits with your health care provider that can promote health and wellness. Preventive care visits are also called wellness exams. What can I expect for my preventive care visit? Counseling During your preventive care visit, your health care provider may ask about your: Medical history, including: Past medical problems. Family medical history. History of falls. Current health, including: Emotional well-being. Home life and relationship well-being. Sexual activity. Memory and ability to understand (cognition). Lifestyle, including: Alcohol, nicotine or  tobacco, and drug use. Access to firearms. Diet, exercise, and sleep habits. Work and work Statistician. Sunscreen use. Safety issues such as seatbelt and bike helmet use. Physical exam Your health care provider will check your: Height and weight. These may be used to calculate your BMI (body mass index). BMI is a measurement that tells if you are at a healthy weight. Waist circumference. This measures the distance around your waistline. This measurement also tells if you are at a healthy weight and may help predict your risk of certain diseases, such as type 2 diabetes and high blood pressure. Heart rate and blood pressure. Body temperature. Skin for abnormal spots. What immunizations do I need? Vaccines are usually given at various ages, according to a schedule. Your health care provider will recommend vaccines for you based on your age, medical history, and lifestyle or other factors, such as travel or where you work. What tests do I need? Screening Your health care provider may recommend screening tests for certain conditions. This may include: Lipid and cholesterol levels. Diabetes screening. This is done by checking your blood sugar (glucose) after you have not eaten for a while (fasting). Hepatitis C test. Hepatitis B test. HIV (human immunodeficiency virus) test. STI (sexually transmitted infection) testing, if you are at risk. Lung cancer screening. Colorectal cancer screening. Prostate cancer screening. Abdominal aortic aneurysm (AAA) screening. You may need this if you are a current or former smoker. Talk  with your health care provider about your test results, treatment options, and if necessary, the need for more tests. Follow these instructions at home: Eating and drinking  Eat a diet that includes fresh fruits and vegetables, whole grains, lean protein, and low-fat dairy products. Limit your intake of foods with high amounts of sugar, saturated fats, and salt. Take  vitamin and mineral supplements as recommended by your health care provider. Do not drink alcohol if your health care provider tells you not to drink. If you drink alcohol: Limit how much you have to 0-2 drinks a day. Know how much alcohol is in your drink. In the [place] one drink equals one 12 oz bottle of beer (355 mL), one 5 oz glass of wine (148 mL), or one 1 oz glass of hard liquor (44 mL). Lifestyle Brush your teeth every morning and night with fluoride toothpaste. Floss one time each day. Exercise for at least 30 minutes 5 or more days each week. Do not use any products that contain nicotine or tobacco. These products include cigarettes, chewing tobacco, and vaping devices, such as e-cigarettes. If you need help quitting, ask your health care provider. Do not use drugs. If you are sexually active, practice safe sex. Use a condom or other form of protection to prevent STIs. Take aspirin only as told by your health care provider. Make sure that you understand how much to take and what form to take. Work with your health care provider to find out whether it is safe and beneficial for you to take aspirin daily. Ask your health care provider if you need to take a cholesterol-lowering medicine (statin). Find healthy ways to manage stress, such as: Meditation, yoga, or listening to music. Journaling. Talking to a trusted person. Spending time with friends and family. Safety Always wear your seat belt while driving or riding in a vehicle. Do not drive: If you have been drinking alcohol. Do not ride with someone who has been drinking. When you are tired or distracted. While texting. If you have been using any mind-altering substances or drugs. Wear a helmet and other protective equipment during sports activities. If you have firearms in your house, make sure you follow all gun safety procedures. Minimize exposure to UV radiation to reduce your risk of skin cancer. What's next? Visit your  health care provider once a year for an annual wellness visit. Ask your health care provider how often you should have your eyes and teeth checked. Stay up to date on all vaccines. This information is not intended to replace advice given to you by your health care provider. Make sure you discuss any questions you have with your health care provider. Document Revised: 07/07/2020 Document Reviewed: 07/07/2020 Elsevier Patient Education  [place].

## 2021-02-03 NOTE — Assessment & Plan Note (Addendum)
Anticoagulated with warfarin. We discussed switching to DOAC however he feels comfortable where he is for now. He does not wish to make any changes at this point. He will let us know if he wishes to have a referral to hematology or if he would like to switch medications.

## 2021-02-03 NOTE — Assessment & Plan Note (Signed)
Above goal today thought typically well controlled. Will continue amlodipine 10mg  daily and lisinopril-HCTZ 99234 once daily.

## 2021-02-03 NOTE — Assessment & Plan Note (Signed)
Still has a fair amount of pain but this is manageable. Will continue management per orthopedics.

## 2021-02-04 LAB — COMPREHENSIVE METABOLIC PANEL
ALT: 25 U/L (ref 0–53)
AST: 20 U/L (ref 0–37)
Albumin: 4.8 g/dL (ref 3.5–5.2)
Alkaline Phosphatase: 52 U/L (ref 39–117)
BUN: 17 mg/dL (ref 6–23)
CO2: 27 mEq/L (ref 19–32)
Calcium: 9.5 mg/dL (ref 8.4–10.5)
Chloride: 104 mEq/L (ref 96–112)
Creatinine, Ser: 0.97 mg/dL (ref 0.40–1.50)
GFR: 79.15 mL/min (ref >60.00)
Glucose, Bld: 90 mg/dL (ref 70–99)
Potassium: 3.9 mEq/L (ref 3.5–5.1)
Sodium: 142 mEq/L (ref 135–145)
Total Bilirubin: 0.5 mg/dL (ref 0.2–1.2)
Total Protein: 7.5 g/dL (ref 6.0–8.3)

## 2021-02-04 LAB — LIPID PANEL
Cholesterol: 213 mg/dL — ABNORMAL HIGH (ref 0–200)
HDL: 37 mg/dL — ABNORMAL LOW (ref >39.00)
NonHDL: 176.03
Total CHOL/HDL Ratio: 6
Triglycerides: 227 mg/dL — ABNORMAL HIGH (ref 0.0–149.0)
VLDL: 45.4 mg/dL — ABNORMAL HIGH (ref 0.0–40.0)

## 2021-02-04 LAB — CBC
HCT: 45.3 % (ref 39.0–52.0)
Hemoglobin: 14.8 g/dL (ref 13.0–17.0)
MCHC: 32.6 g/dL (ref 30.0–36.0)
MCV: 89.3 fl (ref 78.0–100.0)
Platelets: 218 10*3/uL (ref 150.0–400.0)
RBC: 5.07 Mil/uL (ref 4.22–5.81)
RDW: 14.6 % (ref 11.5–15.5)
WBC: 5.3 10*3/uL (ref 4.0–10.5)

## 2021-02-04 LAB — TSH: TSH: 1.32 u[IU]/mL (ref 0.35–5.50)

## 2021-02-04 LAB — LDL CHOLESTEROL, DIRECT: Direct LDL: 143 mg/dL

## 2021-02-04 LAB — HEMOGLOBIN A1C: Hgb A1c MFr Bld: 6.1 % (ref 4.6–6.5)

## 2021-02-07 NOTE — Progress Notes (Signed)
Please inform patient of the following:  Cholesterol and blood sugar are elevated. Recommend starting a cholesterol medication to improve his numbers and lower risk of heart attack and stroke. Please send in lipitor 40mg  daily if he is willing to start. Regardless he should continue to work on diet and exercise. We should recheck in a year.  Algis Greenhouse. Jerline Pain, MD 02/07/2021 8:15 AM

## 2021-02-15 ENCOUNTER — Telehealth: Payer: Self-pay | Admitting: Family Medicine

## 2021-02-15 NOTE — Telephone Encounter (Signed)
Spoke with patient he declined AWV stating he was not interested.

## 2021-02-23 ENCOUNTER — Other Ambulatory Visit: Payer: Self-pay

## 2021-02-23 ENCOUNTER — Ambulatory Visit (INDEPENDENT_AMBULATORY_CARE_PROVIDER_SITE_OTHER): Payer: PPO

## 2021-02-23 DIAGNOSIS — Z7901 Long term (current) use of anticoagulants: Secondary | ICD-10-CM | POA: Diagnosis not present

## 2021-02-23 LAB — POCT INR: INR: 2.1 (ref 2.0–3.0)

## 2021-02-23 NOTE — Progress Notes (Signed)
I have reviewed the patient's encounter and agree with the documentation.  Algis Greenhouse. Jerline Pain, MD 02/23/2021 8:57 AM

## 2021-02-23 NOTE — Patient Instructions (Addendum)
Pre visit review using our clinic review tool, if applicable. No additional management support is needed unless otherwise documented below in the visit note.  Continue to take 1/2 tablet (3 mg) on Wednesdays and 1 tablet (6 mg) all other days.   Re-check in 6 week.

## 2021-02-23 NOTE — Progress Notes (Signed)
Continue to take 1/2 tablet (3 mg) on Wednesdays and 1 tablet (6 mg) all other days.   Re-check in 6 week.

## 2021-04-06 ENCOUNTER — Ambulatory Visit (INDEPENDENT_AMBULATORY_CARE_PROVIDER_SITE_OTHER): Payer: PPO

## 2021-04-06 ENCOUNTER — Ambulatory Visit: Payer: PPO

## 2021-04-06 DIAGNOSIS — Z7901 Long term (current) use of anticoagulants: Secondary | ICD-10-CM

## 2021-04-06 LAB — POCT INR: INR: 1.8 — AB (ref 2.0–3.0)

## 2021-04-06 NOTE — Progress Notes (Signed)
I have reviewed the patient's encounter and agree with the documentation. ? ?Algis Greenhouse. Jerline Pain, MD ?04/06/2021 8:12 AM  ? ?

## 2021-04-06 NOTE — Patient Instructions (Addendum)
Pre visit review using our clinic review tool, if applicable. No additional management support is needed unless otherwise documented below in the visit note. ? ?Increase dose today to take 1 tablet ('6mg'$ ) and then continue to take 1/2 tablet (3 mg) on Wednesdays and 1 tablet (6 mg) all other days.   Re-check in 3 week.  ?

## 2021-04-06 NOTE — Progress Notes (Signed)
Increase dose today to take 1 tablet ('6mg'$ ) and then continue to take 1/2 tablet (3 mg) on Wednesdays and 1 tablet (6 mg) all other days.   Re-check in 3 week.  ?

## 2021-04-27 ENCOUNTER — Ambulatory Visit (INDEPENDENT_AMBULATORY_CARE_PROVIDER_SITE_OTHER): Payer: PPO

## 2021-04-27 DIAGNOSIS — Z7901 Long term (current) use of anticoagulants: Secondary | ICD-10-CM | POA: Diagnosis not present

## 2021-04-27 LAB — POCT INR: INR: 1.9 — AB (ref 2.0–3.0)

## 2021-04-27 NOTE — Progress Notes (Signed)
Increase dose today to take 1 tablet ('6mg'$ ) and then change weekly dose to take 1 tablet daily.  Re-check in 3 week.  ?

## 2021-04-27 NOTE — Patient Instructions (Addendum)
Pre visit review using our clinic review tool, if applicable. No additional management support is needed unless otherwise documented below in the visit note. ? ?Increase dose today to take 1 tablet ('6mg'$ ) and then change weekly dose to take 1 tablet daily.  Re-check in 3 week.  ?

## 2021-04-27 NOTE — Progress Notes (Signed)
I have reviewed the patient's encounter and agree with the documentation. ? ?Algis Greenhouse. Jerline Pain, MD ?04/27/2021 8:24 AM  ? ?

## 2021-05-18 ENCOUNTER — Ambulatory Visit: Payer: PPO

## 2021-05-18 DIAGNOSIS — Z8546 Personal history of malignant neoplasm of prostate: Secondary | ICD-10-CM | POA: Diagnosis not present

## 2021-05-18 LAB — PSA: PSA: 0.085

## 2021-05-25 ENCOUNTER — Ambulatory Visit (INDEPENDENT_AMBULATORY_CARE_PROVIDER_SITE_OTHER): Payer: PPO

## 2021-05-25 DIAGNOSIS — Z7901 Long term (current) use of anticoagulants: Secondary | ICD-10-CM

## 2021-05-25 DIAGNOSIS — Z8546 Personal history of malignant neoplasm of prostate: Secondary | ICD-10-CM | POA: Diagnosis not present

## 2021-05-25 DIAGNOSIS — R311 Benign essential microscopic hematuria: Secondary | ICD-10-CM | POA: Diagnosis not present

## 2021-05-25 LAB — POCT INR: INR: 2 (ref 2.0–3.0)

## 2021-05-25 NOTE — Progress Notes (Signed)
I have reviewed the patient's encounter and agree with the documentation. ? ?Algis Greenhouse. Jerline Pain, MD ?05/25/2021 9:27 AM  ? ?

## 2021-05-25 NOTE — Progress Notes (Addendum)
Pt has been under INR goal for the last 2 visits and before that he was just above the bottom of the INR range. Due to this a change in his weekly dose has been made even though he is in range today at 2.0. Would like pt closer to 2.5.  ?Change weekly dose to take 1 tablet daily except take 1 1/2 tablets on Wednesdays.  Re-check in 4 week.  ?

## 2021-05-25 NOTE — Patient Instructions (Addendum)
Pre visit review using our clinic review tool, if applicable. No additional management support is needed unless otherwise documented below in the visit note. ? ?Change weekly dose to take 1 tablet daily except take 1 1/2 tablets on Wednesdays.  Re-check in 4 week.  ?

## 2021-05-28 ENCOUNTER — Encounter: Payer: Self-pay | Admitting: Family Medicine

## 2021-06-22 ENCOUNTER — Ambulatory Visit (INDEPENDENT_AMBULATORY_CARE_PROVIDER_SITE_OTHER): Payer: PPO

## 2021-06-22 DIAGNOSIS — Z7901 Long term (current) use of anticoagulants: Secondary | ICD-10-CM | POA: Diagnosis not present

## 2021-06-22 LAB — POCT INR: INR: 2 (ref 2.0–3.0)

## 2021-06-22 NOTE — Progress Notes (Signed)
I have reviewed the patient's encounter and agree with the documentation.  Algis Greenhouse. Jerline Pain, MD 06/22/2021 8:54 AM

## 2021-06-22 NOTE — Progress Notes (Addendum)
Pt has been incorporating a lot of greens into his diet trying to eat healthier and lose weight. Pt denies any missed doses and reports he is taking warfarin as directed.  Due to the INR being subtherapeutic or at the lowest of his range there was a change in his weekly dose trying to move pt closer to an INR of 2.5.  Change weekly dose to take 1 tablet daily except take 1 1/2 tablets on Sundays and Wednesdays.  Re-check in 4 week.

## 2021-06-22 NOTE — Patient Instructions (Addendum)
Pre visit review using our clinic review tool, if applicable. No additional management support is needed unless otherwise documented below in the visit note.  Change weekly dose to take 1 tablet daily except take 1 1/2 tablets on Sundays and Wednesdays.  Re-check in 4 week.

## 2021-06-23 DIAGNOSIS — R311 Benign essential microscopic hematuria: Secondary | ICD-10-CM | POA: Diagnosis not present

## 2021-07-20 ENCOUNTER — Ambulatory Visit (INDEPENDENT_AMBULATORY_CARE_PROVIDER_SITE_OTHER): Payer: PPO

## 2021-07-20 DIAGNOSIS — Z7901 Long term (current) use of anticoagulants: Secondary | ICD-10-CM | POA: Diagnosis not present

## 2021-07-20 LAB — POCT INR: INR: 2.4 (ref 2.0–3.0)

## 2021-07-20 MED ORDER — WARFARIN SODIUM 6 MG PO TABS: ORAL_TABLET | ORAL | 1 refills | Status: DC

## 2021-07-20 NOTE — Progress Notes (Signed)
I have reviewed and agree with note, evaluation, plan.   Gearl Baratta, MD  

## 2021-07-20 NOTE — Progress Notes (Addendum)
Continue 1 tablet daily except take 1 1/2 tablets on Sundays and Wednesdays.  Re-check in 6 week.   Pt requested refill of warfarin. Sent in refill.

## 2021-07-20 NOTE — Patient Instructions (Addendum)
Pre visit review using our clinic review tool, if applicable. No additional management support is needed unless otherwise documented below in the visit note.  Continue 1 tablet daily except take 1 1/2 tablets on Sundays and Wednesdays. Recheck in 6 week.  

## 2021-08-31 ENCOUNTER — Ambulatory Visit (INDEPENDENT_AMBULATORY_CARE_PROVIDER_SITE_OTHER): Payer: PPO

## 2021-08-31 DIAGNOSIS — Z7901 Long term (current) use of anticoagulants: Secondary | ICD-10-CM

## 2021-08-31 LAB — POCT INR: INR: 3 (ref 2.0–3.0)

## 2021-08-31 NOTE — Progress Notes (Addendum)
Pt reported he will need an apt on Tuesday going forward so he will go to [place] on Tuesdays for coumadin clinic. Continue 1 tablet daily except take 1 1/2 tablets on Sundays and Wednesdays.  Re-check in 6 week at [facility], [place]

## 2021-08-31 NOTE — Progress Notes (Signed)
I have reviewed the patient's encounter and agree with the documentation.  Algis Greenhouse. Jerline Pain, MD 08/31/2021 9:03 AM

## 2021-08-31 NOTE — Patient Instructions (Addendum)
Pre visit review using our clinic review tool, if applicable. No additional management support is needed unless otherwise documented below in the visit note.  Continue 1 tablet daily except take 1 1/2 tablets on Sundays and Wednesdays.  Re-check in 6 week at [facility], [place]

## 2021-10-11 ENCOUNTER — Ambulatory Visit (INDEPENDENT_AMBULATORY_CARE_PROVIDER_SITE_OTHER): Payer: PPO

## 2021-10-11 DIAGNOSIS — I10 Essential (primary) hypertension: Secondary | ICD-10-CM | POA: Diagnosis not present

## 2021-10-11 DIAGNOSIS — Z7901 Long term (current) use of anticoagulants: Secondary | ICD-10-CM

## 2021-10-11 LAB — POCT INR: INR: 2.6 (ref 2.0–3.0)

## 2021-10-11 MED ORDER — AMLODIPINE BESYLATE 10 MG PO TABS: ORAL_TABLET | ORAL | 1 refills | Status: DC

## 2021-10-11 MED ORDER — LISINOPRIL-HYDROCHLOROTHIAZIDE 20-25 MG PO TABS: 1.0000 | ORAL_TABLET | ORAL | 1 refills | Status: DC

## 2021-10-11 MED ORDER — WARFARIN SODIUM 6 MG PO TABS: ORAL_TABLET | ORAL | 1 refills | Status: DC

## 2021-10-11 NOTE — Progress Notes (Signed)
Continue 1 tablet daily except take 1 1/2 tablets on Sundays and Wednesdays. Recheck in 6 weeks.  

## 2021-10-11 NOTE — Patient Instructions (Signed)
Continue 1 tablet daily except take 1 1/2 tablets on Sundays and Wednesdays. Recheck in 6 weeks.  

## 2021-10-17 ENCOUNTER — Encounter: Payer: Self-pay | Admitting: *Deleted

## 2021-11-16 DIAGNOSIS — Z8546 Personal history of malignant neoplasm of prostate: Secondary | ICD-10-CM | POA: Diagnosis not present

## 2021-11-16 LAB — PSA: PSA: 0.08

## 2021-11-22 ENCOUNTER — Ambulatory Visit (INDEPENDENT_AMBULATORY_CARE_PROVIDER_SITE_OTHER): Payer: PPO

## 2021-11-22 DIAGNOSIS — Z7901 Long term (current) use of anticoagulants: Secondary | ICD-10-CM | POA: Diagnosis not present

## 2021-11-22 LAB — POCT INR: INR: 3.5 — AB (ref 2.0–3.0)

## 2021-11-22 NOTE — Progress Notes (Signed)
Pt reports eating less salads and greens than normal over the past 2 weeks.  Hold today's dose, then continue 1 tablet daily except take 1 1/2 tablets on Sundays and Wednesdays.  Recheck in 4 weeks.

## 2021-11-22 NOTE — Patient Instructions (Signed)
Hold today's dose, then continue 1 tablet daily except take 1 1/2 tablets on Sundays and Wednesdays.  Recheck in 4 weeks.

## 2021-12-20 ENCOUNTER — Ambulatory Visit (INDEPENDENT_AMBULATORY_CARE_PROVIDER_SITE_OTHER): Payer: PPO

## 2021-12-20 DIAGNOSIS — Z7901 Long term (current) use of anticoagulants: Secondary | ICD-10-CM

## 2021-12-20 DIAGNOSIS — Z8546 Personal history of malignant neoplasm of prostate: Secondary | ICD-10-CM | POA: Diagnosis not present

## 2021-12-20 DIAGNOSIS — R350 Frequency of micturition: Secondary | ICD-10-CM | POA: Diagnosis not present

## 2021-12-20 LAB — POCT INR: INR: 2.7 (ref 2.0–3.0)

## 2021-12-20 NOTE — Patient Instructions (Signed)
Continue 1 tablet daily except take 1 1/2 tablets on Sundays and Wednesdays.  Recheck in 6 weeks, on 01/31/22 at 8:30.

## 2021-12-20 NOTE — Progress Notes (Signed)
Continue 1 tablet daily except take 1 1/2 tablets on Sundays and Wednesdays. Recheck in 6 weeks.  

## 2021-12-27 ENCOUNTER — Encounter: Payer: Self-pay | Admitting: Family Medicine

## 2022-01-05 ENCOUNTER — Encounter: Payer: Self-pay | Admitting: *Deleted

## 2022-01-09 ENCOUNTER — Telehealth: Payer: Self-pay | Admitting: Family Medicine

## 2022-01-09 NOTE — Telephone Encounter (Signed)
Copied from [place] [id number]. Topic: Medicare AWV >> Jan 09, 2022 10:31 AM Gillis Santa wrote: Reason for CRM: LVM FOR PATIENT TO CALL [phone] [place]

## 2022-01-13 DIAGNOSIS — R0602 Shortness of breath: Secondary | ICD-10-CM | POA: Diagnosis not present

## 2022-01-13 DIAGNOSIS — B349 Viral infection, unspecified: Secondary | ICD-10-CM | POA: Diagnosis not present

## 2022-01-13 DIAGNOSIS — R509 Fever, unspecified: Secondary | ICD-10-CM | POA: Diagnosis not present

## 2022-01-13 DIAGNOSIS — R5381 Other malaise: Secondary | ICD-10-CM | POA: Diagnosis not present

## 2022-01-13 DIAGNOSIS — R519 Headache, unspecified: Secondary | ICD-10-CM | POA: Diagnosis not present

## 2022-01-31 ENCOUNTER — Ambulatory Visit (INDEPENDENT_AMBULATORY_CARE_PROVIDER_SITE_OTHER): Payer: PPO

## 2022-01-31 DIAGNOSIS — Z7901 Long term (current) use of anticoagulants: Secondary | ICD-10-CM | POA: Diagnosis not present

## 2022-01-31 LAB — POCT INR: INR: 2.7 (ref 2.0–3.0)

## 2022-01-31 MED ORDER — WARFARIN SODIUM 6 MG PO TABS: ORAL_TABLET | ORAL | 1 refills | Status: DC

## 2022-01-31 NOTE — Patient Instructions (Addendum)
Pre visit review using our clinic review tool, if applicable. No additional management support is needed unless otherwise documented below in the visit note.  Continue 1 tablet daily except take 1 1/2 tablets on Sundays and Wednesdays. Recheck in 6 weeks.  

## 2022-01-31 NOTE — Progress Notes (Signed)
Continue 1 tablet daily except take 1 1/2 tablets on Sundays and Wednesdays. Recheck in 6 weeks.  

## 2022-02-06 ENCOUNTER — Encounter: Payer: PPO | Admitting: Family Medicine

## 2022-02-14 ENCOUNTER — Ambulatory Visit (INDEPENDENT_AMBULATORY_CARE_PROVIDER_SITE_OTHER): Payer: PPO | Admitting: Family Medicine

## 2022-02-14 ENCOUNTER — Encounter: Payer: Self-pay | Admitting: Family Medicine

## 2022-02-14 VITALS — BP 121/68 | HR 67 | Temp 97.5°F | Ht 70.0 in | Wt 243.2 lb

## 2022-02-14 DIAGNOSIS — R739 Hyperglycemia, unspecified: Secondary | ICD-10-CM | POA: Diagnosis not present

## 2022-02-14 DIAGNOSIS — Z0001 Encounter for general adult medical examination with abnormal findings: Secondary | ICD-10-CM | POA: Diagnosis not present

## 2022-02-14 DIAGNOSIS — E785 Hyperlipidemia, unspecified: Secondary | ICD-10-CM | POA: Diagnosis not present

## 2022-02-14 DIAGNOSIS — D6859 Other primary thrombophilia: Secondary | ICD-10-CM

## 2022-02-14 DIAGNOSIS — I10 Essential (primary) hypertension: Secondary | ICD-10-CM

## 2022-02-14 DIAGNOSIS — C61 Malignant neoplasm of prostate: Secondary | ICD-10-CM | POA: Diagnosis not present

## 2022-02-14 DIAGNOSIS — G4733 Obstructive sleep apnea (adult) (pediatric): Secondary | ICD-10-CM

## 2022-02-14 DIAGNOSIS — Z9989 Dependence on other enabling machines and devices: Secondary | ICD-10-CM | POA: Insufficient documentation

## 2022-02-14 NOTE — Assessment & Plan Note (Signed)
At goal today on amlodipine 10 mg daily and lisinopril-HCTZ 20-25 once daily.  Check labs.

## 2022-02-14 NOTE — Patient Instructions (Signed)
It was very nice to see you today!  I will refer you to a sleep doctor today.  We will check labs today.  Please let me know if you change your mind about the colonoscopy.  I will see you back in a year for your next physical. Please come back sooner if needed.   Take care, Dr Jerline Pain  PLEASE NOTE:  If you had any lab tests, please let us know if you have not heard back within a few days. You may see your results on mychart before we have a chance to review them but we will give you a call once they are reviewed by [place].   If we ordered any referrals today, please let us know if you have not heard from their office within the next week.   If you had any urgent prescriptions sent in today, please check with the pharmacy within an hour of our visit to make sure the prescription was transmitted appropriately.   Please try these tips to maintain a healthy lifestyle:  Eat at least 3 REAL meals and 1-2 snacks per day.  Aim for no more than 5 hours between eating.  If you eat breakfast, please do so within one hour of getting up.   Each meal should contain half fruits/vegetables, one quarter protein, and one quarter carbs (no bigger than a computer mouse)  Cut down on sweet beverages. This includes juice, soda, and sweet tea.   Drink at least 1 glass of water with each meal and aim for at least 8 glasses per day  Exercise at least 150 minutes every week.    Preventive Care 60 Years and Older, Male Preventive care refers to lifestyle choices and visits with your health care provider that can promote health and wellness. Preventive care visits are also called wellness exams. What can I expect for my preventive care visit? Counseling During your preventive care visit, your health care provider may ask about your: Medical history, including: Past medical problems. Family medical history. History of falls. Current health, including: Emotional well-being. Home life and relationship  well-being. Sexual activity. Memory and ability to understand (cognition). Lifestyle, including: Alcohol, nicotine or tobacco, and drug use. Access to firearms. Diet, exercise, and sleep habits. Work and work Statistician. Sunscreen use. Safety issues such as seatbelt and bike helmet use. Physical exam Your health care provider will check your: Height and weight. These may be used to calculate your BMI (body mass index). BMI is a measurement that tells if you are at a healthy weight. Waist circumference. This measures the distance around your waistline. This measurement also tells if you are at a healthy weight and may help predict your risk of certain diseases, such as type 2 diabetes and high blood pressure. Heart rate and blood pressure. Body temperature. Skin for abnormal spots. What immunizations do I need?  Vaccines are usually given at various ages, according to a schedule. Your health care provider will recommend vaccines for you based on your age, medical history, and lifestyle or other factors, such as travel or where you work. What tests do I need? Screening Your health care provider may recommend screening tests for certain conditions. This may include: Lipid and cholesterol levels. Diabetes screening. This is done by checking your blood sugar (glucose) after you have not eaten for a while (fasting). Hepatitis C test. Hepatitis B test. HIV (human immunodeficiency virus) test. STI (sexually transmitted infection) testing, if you are at risk. Lung cancer screening. Colorectal cancer  screening. Prostate cancer screening. Abdominal aortic aneurysm (AAA) screening. You may need this if you are a current or former smoker. Talk with your health care provider about your test results, treatment options, and if necessary, the need for more tests. Follow these instructions at home: Eating and drinking  Eat a diet that includes fresh fruits and vegetables, whole grains, lean  protein, and low-fat dairy products. Limit your intake of foods with high amounts of sugar, saturated fats, and salt. Take vitamin and mineral supplements as recommended by your health care provider. Do not drink alcohol if your health care provider tells you not to drink. If you drink alcohol: Limit how much you have to 0-2 drinks a day. Know how much alcohol is in your drink. In the [place] one drink equals one 12 oz bottle of beer (355 mL), one 5 oz glass of wine (148 mL), or one 1 oz glass of hard liquor (44 mL). Lifestyle Brush your teeth every morning and night with fluoride toothpaste. Floss one time each day. Exercise for at least 30 minutes 5 or more days each week. Do not use any products that contain nicotine or tobacco. These products include cigarettes, chewing tobacco, and vaping devices, such as e-cigarettes. If you need help quitting, ask your health care provider. Do not use drugs. If you are sexually active, practice safe sex. Use a condom or other form of protection to prevent STIs. Take aspirin only as told by your health care provider. Make sure that you understand how much to take and what form to take. Work with your health care provider to find out whether it is safe and beneficial for you to take aspirin daily. Ask your health care provider if you need to take a cholesterol-lowering medicine (statin). Find healthy ways to manage stress, such as: Meditation, yoga, or listening to music. Journaling. Talking to a trusted person. Spending time with friends and family. Safety Always wear your seat belt while driving or riding in a vehicle. Do not drive: If you have been drinking alcohol. Do not ride with someone who has been drinking. When you are tired or distracted. While texting. If you have been using any mind-altering substances or drugs. Wear a helmet and other protective equipment during sports activities. If you have firearms in your house, make sure you follow  all gun safety procedures. Minimize exposure to UV radiation to reduce your risk of skin cancer. What's next? Visit your health care provider once a year for an annual wellness visit. Ask your health care provider how often you should have your eyes and teeth checked. Stay up to date on all vaccines. This information is not intended to replace advice given to you by your health care provider. Make sure you discuss any questions you have with your health care provider. Document Revised: 07/07/2020 Document Reviewed: 07/07/2020 Elsevier Patient Education  [place].

## 2022-02-14 NOTE — Assessment & Plan Note (Signed)
Check A1c.  Last A1c 6.1.

## 2022-02-14 NOTE — Progress Notes (Signed)
Chief Complaint:  Arthur King is a 72 y.o. male who presents today for his annual comprehensive physical exam.    Assessment/Plan:  Chronic Problems Addressed Today: Essential hypertension At goal today on amlodipine 10 mg daily and lisinopril-HCTZ 20-25 once daily.  Check labs.  Hyperglycemia Check A1c.  Last A1c 6.1.  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  Prostate cancer [facility]) Follows with urology.  Dyslipidemia Recheck labs today.  We discussed lifestyle modifications.  Recommended he start statin last year however he deferred.   Preventative Healthcare: Declined colonoscopy.  Up-to-date on vaccines.  Check labs.  Patient Counseling(The following topics were reviewed and/or handout was given):  -Nutrition: Stressed importance of moderation in sodium/caffeine intake, saturated fat and cholesterol, caloric balance, sufficient intake of fresh fruits, vegetables, and fiber.  -Stressed the importance of regular exercise.   -Substance Abuse: Discussed cessation/primary prevention of tobacco, alcohol, or other drug use; driving or other dangerous activities under the influence; availability of treatment for abuse.   -Injury prevention: Discussed safety belts, safety helmets, smoke detector, smoking near bedding or upholstery.   -Sexuality: Discussed sexually transmitted diseases, partner selection, use of condoms, avoidance of unintended pregnancy and contraceptive alternatives.   -Dental health: Discussed importance of regular tooth brushing, flossing, and dental visits.  -Health maintenance and immunizations reviewed. Please refer to Health maintenance section.  Return to care in 1 year for next preventative visit.     Subjective:  HPI:  He has no acute complaints today.   Lifestyle Diet: Balanced.  Exercise: Limited due to back pain.      02/14/2022    1:42 PM  Depression screen PHQ 2/9  Decreased Interest 0  Down, Depressed, Hopeless 0  PHQ  - 2 Score 0    Health Maintenance Due  Topic Date Due   Hepatitis C Screening  Never done   COLONOSCOPY (Pts 45-62yr Insurance coverage will need to be confirmed)  Never done   Medicare Annual Wellness (AWV)  12/15/2020     ROS: Per HPI, otherwise a complete review of systems was negative.   PMH:  The following were reviewed and entered/updated in epic: Past Medical History:  Diagnosis Date   Arthritis    Childhood asthma    DVT, lower extremity, recurrent, [place]   Hearing loss    Hypertension    Obese    OSA on CPAP    Pre-diabetes    Prostate cancer ([place]    Protein S deficiency ([place]    Vitamin D deficiency    Patient Active Problem List   Diagnosis Date Noted   Dyslipidemia 02/14/2022   OSA on CPAP 02/14/2022   Lumbar disc disease 02/03/2021   Long term (current) use of anticoagulants 02/18/2020   Essential hypertension 02/16/2020   Hyperglycemia 02/16/2020   Protein S [place] 02/16/2020   [place] 02/16/[place]   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 No pertinent family history.  Medications- reviewed and updated Current Outpatient Medications  Medication Sig Dispense Refill   albuterol (VENTOLIN HFA) 108 (90 Base) MCG/ACT inhaler Inhale 1-2 puffs into the lungs every 6 (six) hours as needed for wheezing or shortness of breath. 90 g 0   amLODipine (NORVASC) 10 MG tablet TAKE 1 TABLET(10 MG) BY MOUTH EVERY MORNING 90 tablet 1   lisinopril-hydrochlorothiazide (ZESTORETIC) 20-25 MG tablet Take 1 tablet by mouth every morning.  90 tablet 1   Multiple Vitamins-Minerals (CENTRUM SILVER 50+MEN) TABS Take 1 tablet by mouth daily.     warfarin (COUMADIN) 6 MG tablet TAKE 1 TABLET BY MOUTH DAILY EXCEPT TAKE 1 1/2 TABLETS ON WEDNESDAYS AND SUNDAYS OR AS DIRECTED BY COAGULATION CLINIC 160 tablet 1   albuterol (PROVENTIL) (2.5 MG/3ML) 0.083% nebulizer solution Take 3 mLs (2.5 mg total) by nebulization every 6 (six) hours as needed for wheezing or shortness of breath. (Patient not taking: Reported on 02/14/2022) 75 mL 12   No current facility-administered medications for this visit.    Allergies-reviewed and updated Allergies  Allergen Reactions   Neosporin [Bacitracin-Polymyxin B] Rash    Social History   Socioeconomic History   Marital status: Married    Spouse name: Not on file   Number of children: Not on file   Years of education: Not on file   Highest education level: Not on file  Occupational History   Not on file  Tobacco Use   Smoking status: Never   Smokeless tobacco: Never  Vaping Use   Vaping Use: Never used  Substance and Sexual Activity   Alcohol use: Never   Drug use: Never   Sexual activity: Not on file  Other Topics Concern   Not on file  Social History Narrative   Not on file   Social Determinants of Health   Financial Resource Strain: Not on file  Food Insecurity: Not on file  Transportation Needs: Not on file  Physical Activity: Not on file  Stress: Not on file  Social Connections: Not on file        Objective:  Physical Exam: BP 121/68   Pulse 67   Temp (!) 97.5 F (36.4 C) (Temporal)   Ht '5\' 10"'$  (1.778 m)   Wt 243 lb 3.2 oz (110.3 kg)   SpO2 96%   BMI 34.90 kg/m   Body mass index is 34.9 kg/m. Wt Readings from Last 3 Encounters:  02/14/22 243 lb 3.2 oz (110.3 kg)  02/03/21 247 lb 3.2 oz (112.1 kg)  02/16/20 254 lb (115.2 kg)   Gen: NAD, resting comfortably HEENT: TMs normal bilaterally. OP clear. No thyromegaly noted.  CV: RRR with no murmurs appreciated Pulm:  NWOB, CTAB with no crackles, wheezes, or rhonchi GI: Normal bowel sounds present. Soft, Nontender, Nondistended. MSK: no edema, cyanosis, or clubbing noted Skin: warm, dry Neuro: CN2-12 grossly intact. Strength 5/5 in upper and lower extremities. Reflexes symmetric and intact bilaterally.  Psych: Normal affect and thought content     Dodge Ator M. Jerline Pain, MD 02/14/2022 2:16 PM

## 2022-02-14 NOTE — Assessment & Plan Note (Signed)
Follows with urology

## 2022-02-14 NOTE — Assessment & Plan Note (Signed)
Anticoagulated on warfarin.

## 2022-02-14 NOTE — Assessment & Plan Note (Signed)
Recheck labs today.  We discussed lifestyle modifications.  Recommended he start statin last year however he deferred.

## 2022-02-15 LAB — LIPID PANEL
Cholesterol: 199 mg/dL (ref 0–200)
HDL: 37.4 mg/dL — ABNORMAL LOW (ref >39.00)
NonHDL: 161.3
Total CHOL/HDL Ratio: 5
Triglycerides: 237 mg/dL — ABNORMAL HIGH (ref 0.0–149.0)
VLDL: 47.4 mg/dL — ABNORMAL HIGH (ref 0.0–40.0)

## 2022-02-15 LAB — COMPREHENSIVE METABOLIC PANEL
ALT: 21 U/L (ref 0–53)
AST: 19 U/L (ref 0–37)
Albumin: 4.5 g/dL (ref 3.5–5.2)
Alkaline Phosphatase: 53 U/L (ref 39–117)
BUN: 25 mg/dL — ABNORMAL HIGH (ref 6–23)
CO2: 30 mEq/L (ref 19–32)
Calcium: 9.2 mg/dL (ref 8.4–10.5)
Chloride: 102 mEq/L (ref 96–112)
Creatinine, Ser: 1.29 mg/dL (ref 0.40–1.50)
GFR: 55.81 mL/min — ABNORMAL LOW (ref >60.00)
Glucose, Bld: 103 mg/dL — ABNORMAL HIGH (ref 70–99)
Potassium: 3.9 mEq/L (ref 3.5–5.1)
Sodium: 143 mEq/L (ref 135–145)
Total Bilirubin: 0.4 mg/dL (ref 0.2–1.2)
Total Protein: 7.4 g/dL (ref 6.0–8.3)

## 2022-02-15 LAB — TSH: TSH: 1.59 u[IU]/mL (ref 0.35–5.50)

## 2022-02-15 LAB — CBC
HCT: 45 % (ref 39.0–52.0)
Hemoglobin: 15.2 g/dL (ref 13.0–17.0)
MCHC: 33.7 g/dL (ref 30.0–36.0)
MCV: 89.9 fl (ref 78.0–100.0)
Platelets: 225 10*3/uL (ref 150.0–400.0)
RBC: 5 Mil/uL (ref 4.22–5.81)
RDW: 13.6 % (ref 11.5–15.5)
WBC: 9.4 10*3/uL (ref 4.0–10.5)

## 2022-02-15 LAB — LDL CHOLESTEROL, DIRECT: Direct LDL: 128 mg/dL

## 2022-02-15 LAB — HEMOGLOBIN A1C: Hgb A1c MFr Bld: 6.3 % (ref 4.6–6.5)

## 2022-02-17 NOTE — Progress Notes (Signed)
Please inform patient of the following:  His LDL is a little better than last year however still higher than what we would like.  As we discussed at his office visit would recommend starting a cholesterol medication to improve numbers and low risk heart attack and stroke.  Please send in Lipitor 40 mg daily if he is agreeable to start.  His blood sugar is also a little bit since last year.  He should continue to work on diet and exercise and we can recheck this in a year.  His kidney numbers are up a little bit since last time we checked.  Looks like he may be a little dehydrated.  Please make sure that he is getting plenty of fluids and have him come back in a couple of weeks to recheck be met.  We can recheck everything else in a year.

## 2022-03-14 ENCOUNTER — Ambulatory Visit (INDEPENDENT_AMBULATORY_CARE_PROVIDER_SITE_OTHER): Payer: PPO

## 2022-03-14 VITALS — BP 124/76 | HR 69 | Wt 245.2 lb

## 2022-03-14 DIAGNOSIS — Z7901 Long term (current) use of anticoagulants: Secondary | ICD-10-CM

## 2022-03-14 DIAGNOSIS — Z0000 Encounter for general adult medical examination without abnormal findings: Secondary | ICD-10-CM

## 2022-03-14 LAB — POCT INR: INR: 3.4 — AB (ref 2.0–3.0)

## 2022-03-14 NOTE — Patient Instructions (Addendum)
Pre visit review using our clinic review tool, if applicable. No additional management support is needed unless otherwise documented below in the visit note.  Hold dose today and then change weekly dose to take 1 tablet daily except take 1 1/2 on Sundays. Recheck in 3 weeks.

## 2022-03-14 NOTE — Patient Instructions (Signed)
[name] , Thank you for taking time to come for your Medicare Wellness Visit. I appreciate your ongoing commitment to your health goals. Please review the following plan we discussed and let me know if I can assist you in the future.   These are the goals we discussed:  Goals   None     This is a list of the screening recommended for you and due dates:  Health Maintenance  Topic Date Due   COVID-19 Vaccine (1) Never done   Hepatitis C Screening: USPSTF Recommendation to screen - Ages 4-79 yo.  Never done   Colon Cancer Screening  Never done   Zoster (Shingles) Vaccine (2 of 2) 02/21/2022   DTaP/Tdap/Td vaccine (3 - Td or Tdap) 06/21/2022   Medicare Annual Wellness Visit  03/15/2023   Pneumonia Vaccine  Completed   Flu Shot  Completed   HPV Vaccine  Aged Out    Advanced directives: Advance directive discussed with you today. Even though you declined this today please call our office should you change your mind and we can give you the proper paperwork for you to fill out.  Conditions/risks identified: stay healthy   Next appointment: Follow up in one year for your annual wellness visit.   Preventive Care 2 Years and Older, Male  Preventive care refers to lifestyle choices and visits with your health care provider that can promote health and wellness. What does preventive care include? A yearly physical exam. This is also called an annual well check. Dental exams once or twice a year. Routine eye exams. Ask your health care provider how often you should have your eyes checked. Personal lifestyle choices, including: Daily care of your teeth and gums. Regular physical activity. Eating a healthy diet. Avoiding tobacco and drug use. Limiting alcohol use. Practicing safe sex. Taking low doses of aspirin every day. Taking vitamin and mineral supplements as recommended by your health care provider. What happens during an annual well check? The services and screenings done by  your health care provider during your annual well check will depend on your age, overall health, lifestyle risk factors, and family history of disease. Counseling  Your health care provider may ask you questions about your: Alcohol use. Tobacco use. Drug use. Emotional well-being. Home and relationship well-being. Sexual activity. Eating habits. History of falls. Memory and ability to understand (cognition). Work and work Statistician. Screening  You may have the following tests or measurements: Height, weight, and BMI. Blood pressure. Lipid and cholesterol levels. These may be checked every 5 years, or more frequently if you are over 36 years old. Skin check. Lung cancer screening. You may have this screening every year starting at age 72 if you have a 30-pack-year history of smoking and currently smoke or have quit within the past 15 years. Fecal occult blood test (FOBT) of the stool. You may have this test every year starting at age 74. Flexible sigmoidoscopy or colonoscopy. You may have a sigmoidoscopy every 5 years or a colonoscopy every 10 years starting at age 51. Prostate cancer screening. Recommendations will vary depending on your family history and other risks. Hepatitis C blood test. Hepatitis B blood test. Sexually transmitted disease (STD) testing. Diabetes screening. This is done by checking your blood sugar (glucose) after you have not eaten for a while (fasting). You may have this done every 1-3 years. Abdominal aortic aneurysm (AAA) screening. You may need this if you are a current or former smoker. Osteoporosis. You may be  screened starting at age 78 if you are at high risk. Talk with your health care provider about your test results, treatment options, and if necessary, the need for more tests. Vaccines  Your health care provider may recommend certain vaccines, such as: Influenza vaccine. This is recommended every year. Tetanus, diphtheria, and acellular pertussis  (Tdap, Td) vaccine. You may need a Td booster every 10 years. Zoster vaccine. You may need this after age 32. Pneumococcal 13-valent conjugate (PCV13) vaccine. One dose is recommended after age 75. Pneumococcal polysaccharide (PPSV23) vaccine. One dose is recommended after age 45. Talk to your health care provider about which screenings and vaccines you need and how often you need them. This information is not intended to replace advice given to you by your health care provider. Make sure you discuss any questions you have with your health care provider. Document Released: 02/05/2015 Document Revised: 09/29/2015 Document Reviewed: 11/10/2014 Elsevier Interactive Patient Education  2017 [place] Prevention in the Home Falls can cause injuries. They can happen to people of all ages. There are many things you can do to make your home safe and to help prevent falls. What can I do on the outside of my home? Regularly fix the edges of walkways and driveways and fix any cracks. Remove anything that might make you trip as you walk through a door, such as a raised step or threshold. Trim any bushes or trees on the path to your home. Use bright outdoor lighting. Clear any walking paths of anything that might make someone trip, such as rocks or tools. Regularly check to see if handrails are loose or broken. Make sure that both sides of any steps have handrails. Any raised decks and porches should have guardrails on the edges. Have any leaves, snow, or ice cleared regularly. Use sand or salt on walking paths during winter. Clean up any spills in your garage right away. This includes oil or grease spills. What can I do in the bathroom? Use night lights. Install grab bars by the toilet and in the tub and shower. Do not use towel bars as grab bars. Use non-skid mats or decals in the tub or shower. If you need to sit down in the shower, use a plastic, non-slip stool. Keep the floor dry. Clean  up any water that spills on the floor as soon as it happens. Remove soap buildup in the tub or shower regularly. Attach bath mats securely with double-sided non-slip rug tape. Do not have throw rugs and other things on the floor that can make you trip. What can I do in the bedroom? Use night lights. Make sure that you have a light by your bed that is easy to reach. Do not use any sheets or blankets that are too big for your bed. They should not hang down onto the floor. Have a firm chair that has side arms. You can use this for support while you get dressed. Do not have throw rugs and other things on the floor that can make you trip. What can I do in the kitchen? Clean up any spills right away. Avoid walking on wet floors. Keep items that you use a lot in easy-to-reach places. If you need to reach something above you, use a strong step stool that has a grab bar. Keep electrical cords out of the way. Do not use floor polish or wax that makes floors slippery. If you must use wax, use non-skid floor wax. Do not have  throw rugs and other things on the floor that can make you trip. What can I do with my stairs? Do not leave any items on the stairs. Make sure that there are handrails on both sides of the stairs and use them. Fix handrails that are broken or loose. Make sure that handrails are as long as the stairways. Check any carpeting to make sure that it is firmly attached to the stairs. Fix any carpet that is loose or worn. Avoid having throw rugs at the top or bottom of the stairs. If you do have throw rugs, attach them to the floor with carpet tape. Make sure that you have a light switch at the top of the stairs and the bottom of the stairs. If you do not have them, ask someone to add them for you. What else can I do to help prevent falls? Wear shoes that: Do not have high heels. Have rubber bottoms. Are comfortable and fit you well. Are closed at the toe. Do not wear sandals. If you  use a stepladder: Make sure that it is fully opened. Do not climb a closed stepladder. Make sure that both sides of the stepladder are locked into place. Ask someone to hold it for you, if possible. Clearly mark and make sure that you can see: Any grab bars or handrails. First and last steps. Where the edge of each step is. Use tools that help you move around (mobility aids) if they are needed. These include: Canes. Walkers. Scooters. Crutches. Turn on the lights when you go into a dark area. Replace any light bulbs as soon as they burn out. Set up your furniture so you have a clear path. Avoid moving your furniture around. If any of your floors are uneven, fix them. If there are any pets around you, be aware of where they are. Review your medicines with your doctor. Some medicines can make you feel dizzy. This can increase your chance of falling. Ask your doctor what other things that you can do to help prevent falls. This information is not intended to replace advice given to you by your health care provider. Make sure you discuss any questions you have with your health care provider. Document Released: 11/05/2008 Document Revised: 06/17/2015 Document Reviewed: 02/13/2014 Elsevier Interactive Patient Education  2017 [employer].

## 2022-03-14 NOTE — Progress Notes (Signed)
Subjective:   Arthur King is a 72 y.o. male who presents for an Initial Medicare Annual Wellness Visit.   Patient Medicare AWV questionnaire was completed by the patient on 03/10/22; I have confirmed that all information answered by patient is correct and no changes since this date.         Review of Systems     Cardiac Risk Factors include: advanced age (>85mn, >>56women);hypertension;dyslipidemia;obesity (BMI >30kg/m2)     Objective:    Today's Vitals   03/14/22 0745  BP: 124/76  Pulse: 69  SpO2: 96%  Weight: 245 lb 3.2 oz (111.2 kg)   Body mass index is 35.18 kg/m.     03/14/2022    7:54 AM 12/29/2019   12:02 PM 12/29/2019    5:45 AM 12/25/2019   11:36 AM  Advanced Directives  Does Patient Have a Medical Advance Directive? No No No No  Would patient like information on creating a medical advance directive? No - Patient declined No - Patient declined No - Patient declined     Current Medications (verified) Outpatient Encounter Medications as of 03/14/2022  Medication Sig   albuterol (VENTOLIN HFA) 108 (90 Base) MCG/ACT inhaler Inhale 1-2 puffs into the lungs every 6 (six) hours as needed for wheezing or shortness of breath.   amLODipine (NORVASC) 10 MG tablet TAKE 1 TABLET(10 MG) BY MOUTH EVERY MORNING   lisinopril-hydrochlorothiazide (ZESTORETIC) 20-25 MG tablet Take 1 tablet by mouth every morning.   Multiple Vitamins-Minerals (CENTRUM SILVER 50+MEN) TABS Take 1 tablet by mouth daily.   warfarin (COUMADIN) 6 MG tablet TAKE 1 TABLET BY MOUTH DAILY EXCEPT TAKE 1 1/2 TABLETS ON WEDNESDAYS AND SUNDAYS OR AS DIRECTED BY COAGULATION CLINIC   albuterol (PROVENTIL) (2.5 MG/3ML) 0.083% nebulizer solution Take 3 mLs (2.5 mg total) by nebulization every 6 (six) hours as needed for wheezing or shortness of breath. (Patient not taking: Reported on 02/14/2022)   No facility-administered encounter medications on file as of 03/14/2022.    Allergies (verified) Neosporin  [bacitracin-polymyxin b]   History: Past Medical History:  Diagnosis Date   Arthritis    Childhood asthma    DVT, lower extremity, recurrent, left ([facility] 1974   Hearing loss    Hypertension    Obese    OSA on CPAP    Pre-diabetes    Prostate cancer (HCC)    Protein S deficiency [place]    Vitamin D deficiency    Past Surgical History:  Procedure Laterality Date   APPENDECTOMY     CHOLECYSTECTOMY     HERNIA REPAIR     Abdominal   toe nail removal Bilateral    TONSILLECTOMY     TOTAL KNEE ARTHROPLASTY Left 12/29/2019   Procedure: TOTAL KNEE ARTHROPLASTY;  Surgeon: LVickey Huger MD;  Location: [facility];  Service: Orthopedics;  Laterality: Left;   VASECTOMY     VEIN LIGATION AND STRIPPING Left    History reviewed. No pertinent family history. Social History   Socioeconomic History   Marital status: Married    Spouse name: Not on file   Number of children: Not on file   Years of education: Not on file   Highest education level: Not on file  Occupational History   Not on file  Tobacco Use   Smoking status: Never   Smokeless tobacco: Never  Vaping Use   Vaping Use: Never used  Substance and Sexual Activity   Alcohol use: Never   Drug use: Never   Sexual activity:  Not on file  Other Topics Concern   Not on file  Social History Narrative   Not on file   Social Determinants of Health   Financial Resource Strain: Low Risk  (03/10/2022)   Overall Financial Resource Strain (CARDIA)    Difficulty of Paying Living Expenses: Not very hard  Food Insecurity: No Food Insecurity (03/10/2022)   Hunger Vital Sign    Worried About Running Out of Food in the Last Year: Never true    Ran Out of Food in the Last Year: Never true  Transportation Needs: No Transportation Needs (03/10/2022)   PRAPARE - Hydrologist (Medical): No    Lack of Transportation (Non-Medical): No  Physical Activity: Unknown (03/10/2022)   Exercise Vital Sign    Days of Exercise per  Week: Patient refused    Minutes of Exercise per Session: Patient refused  Stress: No Stress Concern Present (03/10/2022)   Bond    Feeling of Stress : Not at all  Social Connections: [place] (03/10/2022)   Social Connection and Isolation Panel [NHANES]    Frequency of Communication with Friends and Family: Once a week    Frequency of Social Gatherings with Friends and Family: More than three times a week    Attends Religious Services: More than 4 times per year    Active Member of [employer] or Organizations: Yes    Attends Music therapist: More than 4 times per year    Marital Status: Married    Tobacco Counseling Counseling given: Not Answered   Clinical Intake:  Pre-visit preparation completed: Yes  Pain : No/denies pain     BMI - recorded: 35.18 Nutritional Status: BMI > 30  Obese Nutritional Risks: None Diabetes: No  How often do you need to have someone help you when you read instructions, pamphlets, or other written materials from your doctor or pharmacy?: 1 - Never  Diabetic?no  Interpreter Needed?: No  Information entered by :: Charlott Rakes, LPN   Activities of Daily Living    03/10/2022    8:52 AM  In your present state of health, do you have any difficulty performing the following activities:  Hearing? 0  Vision? 0  Difficulty concentrating or making decisions? 0  Walking or climbing stairs? 0  Dressing or bathing? 0  Doing errands, shopping? 0  Preparing Food and eating ? N  Using the Toilet? N  In the past six months, have you accidently leaked urine? N  Do you have problems with loss of bowel control? N  Managing your Medications? N  Managing your Finances? N  Housekeeping or managing your Housekeeping? N    Patient Care Team: Vivi Barrack, MD as PCP - General (Family Medicine) Raynelle Bring, MD as Consulting Physician (Urology)  Indicate any  recent Medical Services you may have received from other than [facility] providers in the past year (date may be approximate).     Assessment:   This is a routine wellness examination for Arthur King.  Hearing/Vision screen Hearing Screening - Comments:: Pt wears hearing aids  Vision Screening - Comments:: Pt follows up with lens crafter[facility] may change providers   Dietary issues and exercise activities discussed: Current Exercise Habits: The patient does not participate in regular exercise at present   Goals Addressed             This Visit's Progress    Patient Stated  Stay healthy        Depression Screen    03/14/2022    7:52 AM 02/14/2022    1:42 PM 02/16/2020    1:23 PM  PHQ 2/9 Scores  PHQ - 2 Score 0 0 0    Fall Risk    03/10/2022    8:52 AM 02/14/2022    1:42 PM  [place] in the past year? 0 0  Number falls in past yr: 0 0  Injury with Fall? 0 0  Risk for fall due to : Impaired vision No Fall Risks  Follow up Falls prevention discussed     FALL RISK PREVENTION PERTAINING TO THE HOME:  Any stairs in or around the home? Yes  If so, are there any without handrails? No  Home free of loose throw rugs in walkways, pet beds, electrical cords, etc? Yes  Adequate lighting in your home to reduce risk of falls? Yes   ASSISTIVE DEVICES UTILIZED TO PREVENT FALLS:  Life alert? No  Use of a cane, walker or w/c? No  Grab bars in the bathroom? No  Shower chair or bench in shower? No  Elevated toilet seat or a handicapped toilet? No   TIMED UP AND GO:  Was the test performed? Yes .  Length of time to ambulate 10 feet: 10 sec.   Gait steady and fast without use of assistive device  Cognitive Function:        03/14/2022    7:56 AM  6CIT Screen  What Year? 0 points  What month? 0 points  What time? 0 points  Count back from 20 0 points  Months in reverse 0 points  Repeat phrase 0 points  Total Score 0 points    Immunizations Immunization History   Administered Date(s) Administered   Influenza, High Dose Seasonal PF 11/22/2017, 12/04/2018, 12/11/2019   Influenza,inj,Quad PF,6+ Mos 11/04/2015   Influenza-Unspecified 11/15/2012, 11/27/2013, 02/17/2015, 12/27/2021   Pneumococcal Conjugate-13 07/31/2018   Pneumococcal Polysaccharide-23 11/15/2012, 12/11/2019   Rsv, Bivalent, Protein Subunit Rsvpref,pf Evans Lance) 12/27/2021   Tdap 01/24/2006, 06/20/2012   Zoster Recombinat (Shingrix) 12/27/2021    TDAP status: Up to date  Flu Vaccine status: Up to date  Pneumococcal vaccine status: Up to date  Covid-19 vaccine status: Declined, Education has been provided regarding the importance of this vaccine but patient still declined. Advised may receive this vaccine at local pharmacy or Health Dept.or vaccine clinic. Aware to provide a copy of the vaccination record if obtained from local pharmacy or Health Dept. Verbalized acceptance and understanding.  Qualifies for Shingles Vaccine? Yes   Zostavax completed Yes   Shingrix Completed?: No.    Education has been provided regarding the importance of this vaccine. Patient has been advised to call insurance company to determine out of pocket expense if they have not yet received this vaccine. Advised may also receive vaccine at local pharmacy or Health Dept. Verbalized acceptance and understanding.  Screening Tests Health Maintenance  Topic Date Due   Hepatitis C Screening  Never done   Zoster Vaccines- Shingrix (2 of 2) 02/21/2022   COVID-19 Vaccine (1) 03/30/2022 (Originally 09/11/1955)   COLONOSCOPY (Pts 45-29yr Insurance coverage will need to be confirmed)  03/15/2023 (Originally 09/11/1995)   DTaP/Tdap/Td (3 - Td or Tdap) 06/21/2022   Medicare Annual Wellness (AWV)  03/15/2023   Pneumonia Vaccine 72 Years old  Completed   INFLUENZA VACCINE  Completed   HPV VACCINES  Aged Out    Health Maintenance  Health Maintenance Due  Topic Date Due   Hepatitis C Screening  Never done   Zoster  Vaccines- Shingrix (2 of 2) 02/21/2022    Colorectal cancer screening: No longer required. Per pt    Additional Screening:  Hepatitis C Screening: does qualify;  Vision Screening: Recommended annual ophthalmology exams for early detection of glaucoma and other disorders of the eye. Is the patient up to date with their annual eye exam?  No  Who is the provider or what is the name of the office in which the patient attends annual eye exams? [facility] may  get new provider  If pt is not established with a provider, would they like to be referred to a provider to establish care? No .   Dental Screening: Recommended annual dental exams for proper oral hygiene  Community Resource Referral / Chronic Care Management: CRR required this visit?  No   CCM required this visit?  No      Plan:     I have personally reviewed and noted the following in the patient's chart:   Medical and social history Use of alcohol, tobacco or illicit drugs  Current medications and supplements including opioid prescriptions. Patient is not currently taking opioid prescriptions. Functional ability and status Nutritional status Physical activity Advanced directives List of other physicians Hospitalizations, surgeries, and ER visits in previous 12 months Vitals Screenings to include cognitive, depression, and falls Referrals and appointments  In addition, I have reviewed and discussed with patient certain preventive protocols, quality metrics, and best practice recommendations. A written personalized care plan for preventive services as well as general preventive health recommendations were provided to patient.     Willette Brace, LPN   [id number]   Nurse Notes: none

## 2022-03-14 NOTE — Progress Notes (Signed)
Hold dose today and then change weekly dose to take 1 tablet daily except take 1 1/2 on Sundays. Recheck in 3 weeks.

## 2022-04-04 ENCOUNTER — Ambulatory Visit (INDEPENDENT_AMBULATORY_CARE_PROVIDER_SITE_OTHER): Payer: PPO

## 2022-04-04 DIAGNOSIS — Z7901 Long term (current) use of anticoagulants: Secondary | ICD-10-CM | POA: Diagnosis not present

## 2022-04-04 LAB — POCT INR: INR: 2.8 (ref 2.0–3.0)

## 2022-04-04 NOTE — Patient Instructions (Addendum)
Pre visit review using our clinic review tool, if applicable. No additional management support is needed unless otherwise documented below in the visit note.  Continue 1 tablet daily except take 1 1/2 on Sundays. Recheck in 6 weeks.

## 2022-04-04 NOTE — Progress Notes (Cosign Needed)
Continue 1 tablet daily except take 1 1/2 on Sundays. Recheck in 6 weeks.

## 2022-04-08 ENCOUNTER — Other Ambulatory Visit: Payer: Self-pay | Admitting: Family Medicine

## 2022-04-08 DIAGNOSIS — I10 Essential (primary) hypertension: Secondary | ICD-10-CM

## 2022-05-16 ENCOUNTER — Ambulatory Visit (INDEPENDENT_AMBULATORY_CARE_PROVIDER_SITE_OTHER): Payer: PPO

## 2022-05-16 DIAGNOSIS — Z7901 Long term (current) use of anticoagulants: Secondary | ICD-10-CM

## 2022-05-16 LAB — POCT INR: INR: 3.1 — AB (ref 2.0–3.0)

## 2022-05-16 NOTE — Patient Instructions (Addendum)
Pre visit review using our clinic review tool, if applicable. No additional management support is needed unless otherwise documented below in the visit note.  Decrease dose today to take 1 /2 tablet and the continue 1 tablet daily except take 1 1/2 on Sundays. Recheck in 3 weeks.

## 2022-05-16 NOTE — Progress Notes (Signed)
Decrease dose today to take 1 /2 tablet and the continue 1 tablet daily except take 1 1/2 on Sundays. Recheck in 3 weeks.

## 2022-06-06 ENCOUNTER — Ambulatory Visit (INDEPENDENT_AMBULATORY_CARE_PROVIDER_SITE_OTHER): Payer: PPO

## 2022-06-06 DIAGNOSIS — Z7901 Long term (current) use of anticoagulants: Secondary | ICD-10-CM

## 2022-06-06 LAB — POCT INR: INR: 2.8 (ref 2.0–3.0)

## 2022-06-06 NOTE — Progress Notes (Signed)
Continue 1 tablet daily except take 1 1/2 on Sundays. Recheck in 6 weeks.  

## 2022-06-06 NOTE — Patient Instructions (Addendum)
Pre visit review using our clinic review tool, if applicable. No additional management support is needed unless otherwise documented below in the visit note.  Continue 1 tablet daily except take 1 1/2 on Sundays. Recheck in 6 weeks.  

## 2022-06-28 DIAGNOSIS — Z8546 Personal history of malignant neoplasm of prostate: Secondary | ICD-10-CM | POA: Diagnosis not present

## 2022-06-28 LAB — PSA: PSA: 0.102

## 2022-07-05 DIAGNOSIS — R311 Benign essential microscopic hematuria: Secondary | ICD-10-CM | POA: Diagnosis not present

## 2022-07-05 DIAGNOSIS — Z8546 Personal history of malignant neoplasm of prostate: Secondary | ICD-10-CM | POA: Diagnosis not present

## 2022-07-06 ENCOUNTER — Encounter: Payer: Self-pay | Admitting: Family Medicine

## 2022-07-18 ENCOUNTER — Ambulatory Visit (INDEPENDENT_AMBULATORY_CARE_PROVIDER_SITE_OTHER): Payer: PPO

## 2022-07-18 DIAGNOSIS — Z7901 Long term (current) use of anticoagulants: Secondary | ICD-10-CM

## 2022-07-18 LAB — POCT INR: INR: 3.6 — AB (ref 2.0–3.0)

## 2022-07-18 NOTE — Progress Notes (Addendum)
Eating less greens. Pt denies any bleeding or abnormal bruising. Hold dose today and then change weekly dose to take 1 tablet daily. Recheck in 3 weeks.

## 2022-07-18 NOTE — Patient Instructions (Signed)
Pre visit review using our clinic review tool, if applicable. No additional management support is needed unless otherwise documented below in the visit note.  Hold dose today and then change weekly dose to take 1 tablet daily. Recheck in 3 weeks. 

## 2022-08-08 ENCOUNTER — Ambulatory Visit: Payer: PPO

## 2022-08-08 DIAGNOSIS — Z7901 Long term (current) use of anticoagulants: Secondary | ICD-10-CM

## 2022-08-08 LAB — POCT INR: INR: 2.4 (ref 2.0–3.0)

## 2022-08-08 NOTE — Progress Notes (Signed)
Continue 1 tablet daily. Recheck in 4 weeks.   

## 2022-08-08 NOTE — Patient Instructions (Addendum)
 Pre visit review using our clinic review tool, if applicable. No additional management support is needed unless otherwise documented below in the visit note.  Continue 1 tablet daily . Recheck in 4 weeks.  

## 2022-08-23 ENCOUNTER — Encounter (INDEPENDENT_AMBULATORY_CARE_PROVIDER_SITE_OTHER): Payer: Self-pay

## 2022-09-05 ENCOUNTER — Ambulatory Visit (INDEPENDENT_AMBULATORY_CARE_PROVIDER_SITE_OTHER): Payer: PPO

## 2022-09-05 DIAGNOSIS — Z7901 Long term (current) use of anticoagulants: Secondary | ICD-10-CM

## 2022-09-05 LAB — POCT INR: INR: 2.4 (ref 2.0–3.0)

## 2022-09-05 NOTE — Progress Notes (Signed)
Continue 1 tablet daily. Recheck in 6 weeks.

## 2022-09-05 NOTE — Patient Instructions (Addendum)
Pre visit review using our clinic review tool, if applicable. No additional management support is needed unless otherwise documented below in the visit note.  Continue 1 tablet daily. Recheck in 6 weeks 

## 2022-09-07 ENCOUNTER — Encounter (INDEPENDENT_AMBULATORY_CARE_PROVIDER_SITE_OTHER): Payer: Self-pay

## 2022-10-17 ENCOUNTER — Ambulatory Visit: Payer: PPO

## 2022-10-24 ENCOUNTER — Ambulatory Visit (INDEPENDENT_AMBULATORY_CARE_PROVIDER_SITE_OTHER): Payer: PPO

## 2022-10-24 DIAGNOSIS — Z7901 Long term (current) use of anticoagulants: Secondary | ICD-10-CM | POA: Diagnosis not present

## 2022-10-24 LAB — POCT INR: INR: 2.1 (ref 2.0–3.0)

## 2022-10-24 NOTE — Patient Instructions (Addendum)
Pre visit review using our clinic review tool, if applicable. No additional management support is needed unless otherwise documented below in the visit note.  Continue 1 tablet daily. Recheck in 6 weeks 

## 2022-10-24 NOTE — Progress Notes (Signed)
Continue 1 tablet daily. Recheck in 6 weeks.

## 2022-10-27 ENCOUNTER — Other Ambulatory Visit: Payer: Self-pay | Admitting: Family Medicine

## 2022-10-27 DIAGNOSIS — Z1212 Encounter for screening for malignant neoplasm of rectum: Secondary | ICD-10-CM

## 2022-10-27 DIAGNOSIS — Z1211 Encounter for screening for malignant neoplasm of colon: Secondary | ICD-10-CM

## 2022-12-05 ENCOUNTER — Ambulatory Visit (INDEPENDENT_AMBULATORY_CARE_PROVIDER_SITE_OTHER): Payer: PPO

## 2022-12-05 DIAGNOSIS — Z7901 Long term (current) use of anticoagulants: Secondary | ICD-10-CM

## 2022-12-05 LAB — POCT INR: INR: 1.9 — AB (ref 2.0–3.0)

## 2022-12-05 MED ORDER — WARFARIN SODIUM 6 MG PO TABS
ORAL_TABLET | ORAL | 1 refills | Status: DC
Start: 2022-12-05 — End: 2023-07-31

## 2022-12-05 NOTE — Patient Instructions (Addendum)
Pre visit review using our clinic review tool, if applicable. No additional management support is needed unless otherwise documented below in the visit note.  Increase dose today to take 1 1/2 tablets and then continue 1 tablet daily. Recheck in 3 weeks.

## 2022-12-05 NOTE — Progress Notes (Signed)
Pt missed dose 4 days ago. Increase dose today to take 1 1/2 tablets and then continue 1 tablet daily. Recheck in 3 weeks.    Pt is compliant with warfarin management and PCP apts.  Sent in refill of warfarin to requested pharmacy.

## 2022-12-26 ENCOUNTER — Ambulatory Visit (INDEPENDENT_AMBULATORY_CARE_PROVIDER_SITE_OTHER): Payer: PPO

## 2022-12-26 DIAGNOSIS — Z7901 Long term (current) use of anticoagulants: Secondary | ICD-10-CM | POA: Diagnosis not present

## 2022-12-26 LAB — POCT INR: INR: 2.8 (ref 2.0–3.0)

## 2022-12-26 NOTE — Patient Instructions (Addendum)
Pre visit review using our clinic review tool, if applicable. No additional management support is needed unless otherwise documented below in the visit note.  Continue 1 tablet daily. Recheck in 6 weeks 

## 2022-12-26 NOTE — Progress Notes (Signed)
Continue 1 tablet daily. Recheck in 6 weeks.

## 2023-01-11 DIAGNOSIS — H903 Sensorineural hearing loss, bilateral: Secondary | ICD-10-CM | POA: Diagnosis not present

## 2023-01-23 DIAGNOSIS — H903 Sensorineural hearing loss, bilateral: Secondary | ICD-10-CM | POA: Diagnosis not present

## 2023-02-06 ENCOUNTER — Ambulatory Visit (INDEPENDENT_AMBULATORY_CARE_PROVIDER_SITE_OTHER): Payer: PPO

## 2023-02-06 DIAGNOSIS — Z7901 Long term (current) use of anticoagulants: Secondary | ICD-10-CM

## 2023-02-06 LAB — POCT INR: INR: 2 (ref 2.0–3.0)

## 2023-02-06 NOTE — Progress Notes (Signed)
 Continue 1 tablet daily. Recheck in 6 weeks.

## 2023-02-06 NOTE — Patient Instructions (Addendum)
 Pre visit review using our clinic review tool, if applicable. No additional management support is needed unless otherwise documented below in the visit note.  Continue 1 tablet daily.  Re-check in 6 weeks.

## 2023-02-07 DIAGNOSIS — Z8546 Personal history of malignant neoplasm of prostate: Secondary | ICD-10-CM | POA: Diagnosis not present

## 2023-02-14 DIAGNOSIS — R350 Frequency of micturition: Secondary | ICD-10-CM | POA: Diagnosis not present

## 2023-02-14 DIAGNOSIS — Z8546 Personal history of malignant neoplasm of prostate: Secondary | ICD-10-CM | POA: Diagnosis not present

## 2023-03-20 ENCOUNTER — Ambulatory Visit (INDEPENDENT_AMBULATORY_CARE_PROVIDER_SITE_OTHER): Payer: PPO

## 2023-03-20 DIAGNOSIS — Z7901 Long term (current) use of anticoagulants: Secondary | ICD-10-CM

## 2023-03-20 LAB — POCT INR: INR: 2.2 (ref 2.0–3.0)

## 2023-03-20 NOTE — Patient Instructions (Addendum)
 Pre visit review using our clinic review tool, if applicable. No additional management support is needed unless otherwise documented below in the visit note.  Continue 1 tablet daily. Recheck in 5 weeks.

## 2023-03-20 NOTE — Progress Notes (Signed)
 Continue 1 tablet daily.  Recheck in 5 weeks.

## 2023-04-24 ENCOUNTER — Ambulatory Visit (INDEPENDENT_AMBULATORY_CARE_PROVIDER_SITE_OTHER): Payer: PPO

## 2023-04-24 DIAGNOSIS — Z7901 Long term (current) use of anticoagulants: Secondary | ICD-10-CM | POA: Diagnosis not present

## 2023-04-24 LAB — POCT INR: INR: 1.8 — AB (ref 2.0–3.0)

## 2023-04-24 NOTE — Progress Notes (Signed)
 Pt thinks he missed a dose last week. Increase dose today to take 1 1/2 tablets and then continue 1 tablet daily. Recheck in 3 weeks.

## 2023-04-24 NOTE — Patient Instructions (Addendum)
Pre visit review using our clinic review tool, if applicable. No additional management support is needed unless otherwise documented below in the visit note.  Increase dose today to take 1 1/2 tablets and then continue 1 tablet daily. Recheck in 3 weeks.

## 2023-05-04 ENCOUNTER — Encounter: Payer: Self-pay | Admitting: Family Medicine

## 2023-05-04 ENCOUNTER — Ambulatory Visit (INDEPENDENT_AMBULATORY_CARE_PROVIDER_SITE_OTHER): Payer: PPO | Admitting: Family Medicine

## 2023-05-04 VITALS — BP 137/75 | HR 50 | Temp 97.7°F | Ht 70.0 in | Wt 249.4 lb

## 2023-05-04 DIAGNOSIS — D6859 Other primary thrombophilia: Secondary | ICD-10-CM | POA: Diagnosis not present

## 2023-05-04 DIAGNOSIS — R739 Hyperglycemia, unspecified: Secondary | ICD-10-CM

## 2023-05-04 DIAGNOSIS — I10 Essential (primary) hypertension: Secondary | ICD-10-CM

## 2023-05-04 DIAGNOSIS — C61 Malignant neoplasm of prostate: Secondary | ICD-10-CM | POA: Diagnosis not present

## 2023-05-04 DIAGNOSIS — Z0001 Encounter for general adult medical examination with abnormal findings: Secondary | ICD-10-CM | POA: Diagnosis not present

## 2023-05-04 DIAGNOSIS — G4733 Obstructive sleep apnea (adult) (pediatric): Secondary | ICD-10-CM | POA: Diagnosis not present

## 2023-05-04 DIAGNOSIS — E785 Hyperlipidemia, unspecified: Secondary | ICD-10-CM | POA: Diagnosis not present

## 2023-05-04 LAB — TSH: TSH: 2.1 u[IU]/mL (ref 0.35–5.50)

## 2023-05-04 LAB — LIPID PANEL
Cholesterol: 216 mg/dL — ABNORMAL HIGH (ref 0–200)
HDL: 39.5 mg/dL (ref >39.00)
LDL Cholesterol: 126 mg/dL — ABNORMAL HIGH (ref 0–99)
NonHDL: 176.64
Total CHOL/HDL Ratio: 5
Triglycerides: 255 mg/dL — ABNORMAL HIGH (ref 0.0–149.0)
VLDL: 51 mg/dL — ABNORMAL HIGH (ref 0.0–40.0)

## 2023-05-04 LAB — COMPREHENSIVE METABOLIC PANEL WITH GFR
ALT: 27 U/L (ref 0–53)
AST: 26 U/L (ref 0–37)
Albumin: 4.8 g/dL (ref 3.5–5.2)
Alkaline Phosphatase: 42 U/L (ref 39–117)
BUN: 22 mg/dL (ref 6–23)
CO2: 28 meq/L (ref 19–32)
Calcium: 9.4 mg/dL (ref 8.4–10.5)
Chloride: 101 meq/L (ref 96–112)
Creatinine, Ser: 0.94 mg/dL (ref 0.40–1.50)
GFR: 80.91 mL/min (ref >60.00)
Glucose, Bld: 114 mg/dL — ABNORMAL HIGH (ref 70–99)
Potassium: 4.3 meq/L (ref 3.5–5.1)
Sodium: 139 meq/L (ref 135–145)
Total Bilirubin: 0.4 mg/dL (ref 0.2–1.2)
Total Protein: 7.6 g/dL (ref 6.0–8.3)

## 2023-05-04 LAB — CBC
HCT: 48.1 % (ref 39.0–52.0)
Hemoglobin: 16.2 g/dL (ref 13.0–17.0)
MCHC: 33.6 g/dL (ref 30.0–36.0)
MCV: 91 fl (ref 78.0–100.0)
Platelets: 192 10*3/uL (ref 150.0–400.0)
RBC: 5.29 Mil/uL (ref 4.22–5.81)
RDW: 13.5 % (ref 11.5–15.5)
WBC: 5 10*3/uL (ref 4.0–10.5)

## 2023-05-04 LAB — HEMOGLOBIN A1C: Hgb A1c MFr Bld: 6.1 % (ref 4.6–6.5)

## 2023-05-04 NOTE — Patient Instructions (Signed)
 It was very nice to see you today!  We will check blood work today.   Please keep working on diet and exercise.   Let me know if you would like to try Cologuard or have a referral for colonoscopy.  We will refer you to see the sleep specialist for your sleep apnea.   We will see you back in a year for your next physical. Please come back sooner if needed.   Return in about 1 year (around 05/03/2024) for Annual Physical.   Take care, Dr Jimmey Ralph  PLEASE NOTE:  If you had any lab tests, please let us know if you have not heard back within a few days. You may see your results on mychart before we have a chance to review them but we will give you a call once they are reviewed by [place].   If we ordered any referrals today, please let us know if you have not heard from their office within the next week.   If you had any urgent prescriptions sent in today, please check with the pharmacy within an hour of our visit to make sure the prescription was transmitted appropriately.   Please try these tips to maintain a healthy lifestyle:  Eat at least 3 REAL meals and 1-2 snacks per day.  Aim for no more than 5 hours between eating.  If you eat breakfast, please do so within one hour of getting up.   Each meal should contain half fruits/vegetables, one quarter protein, and one quarter carbs (no bigger than a computer mouse)  Cut down on sweet beverages. This includes juice, soda, and sweet tea.   Drink at least 1 glass of water with each meal and aim for at least 8 glasses per day  Exercise at least 150 minutes every week.    Preventive Care 69 Years and Older, Male Preventive care refers to lifestyle choices and visits with your health care provider that can promote health and wellness. Preventive care visits are also called wellness exams. What can I expect for my preventive care visit? Counseling During your preventive care visit, your health care provider may ask about your: Medical history,  including: Past medical problems. Family medical history. History of falls. Current health, including: Emotional well-being. Home life and relationship well-being. Sexual activity. Memory and ability to understand (cognition). Lifestyle, including: Alcohol, nicotine or tobacco, and drug use. Access to firearms. Diet, exercise, and sleep habits. Work and work Astronomer. Sunscreen use. Safety issues such as seatbelt and bike helmet use. Physical exam Your health care provider will check your: Height and weight. These may be used to calculate your BMI (body mass index). BMI is a measurement that tells if you are at a healthy weight. Waist circumference. This measures the distance around your waistline. This measurement also tells if you are at a healthy weight and may help predict your risk of certain diseases, such as type 2 diabetes and high blood pressure. Heart rate and blood pressure. Body temperature. Skin for abnormal spots. What immunizations do I need?  Vaccines are usually given at various ages, according to a schedule. Your health care provider will recommend vaccines for you based on your age, medical history, and lifestyle or other factors, such as travel or where you work. What tests do I need? Screening Your health care provider may recommend screening tests for certain conditions. This may include: Lipid and cholesterol levels. Diabetes screening. This is done by checking your blood sugar (glucose) after you have  not eaten for a while (fasting). Hepatitis C test. Hepatitis B test. HIV (human immunodeficiency virus) test. STI (sexually transmitted infection) testing, if you are at risk. Lung cancer screening. Colorectal cancer screening. Prostate cancer screening. Abdominal aortic aneurysm (AAA) screening. You may need this if you are a current or former smoker. Talk with your health care provider about your test results, treatment options, and if necessary, the  need for more tests. Follow these instructions at home: Eating and drinking  Eat a diet that includes fresh fruits and vegetables, whole grains, lean protein, and low-fat dairy products. Limit your intake of foods with high amounts of sugar, saturated fats, and salt. Take vitamin and mineral supplements as recommended by your health care provider. Do not drink alcohol if your health care provider tells you not to drink. If you drink alcohol: Limit how much you have to 0-2 drinks a day. Know how much alcohol is in your drink. In the [place] one drink equals one 12 oz bottle of beer (355 mL), one 5 oz glass of wine (148 mL), or one 1 oz glass of hard liquor (44 mL). Lifestyle Brush your teeth every morning and night with fluoride toothpaste. Floss one time each day. Exercise for at least 30 minutes 5 or more days each week. Do not use any products that contain nicotine or tobacco. These products include cigarettes, chewing tobacco, and vaping devices, such as e-cigarettes. If you need help quitting, ask your health care provider. Do not use drugs. If you are sexually active, practice safe sex. Use a condom or other form of protection to prevent STIs. Take aspirin only as told by your health care provider. Make sure that you understand how much to take and what form to take. Work with your health care provider to find out whether it is safe and beneficial for you to take aspirin daily. Ask your health care provider if you need to take a cholesterol-lowering medicine (statin). Find healthy ways to manage stress, such as: Meditation, yoga, or listening to music. Journaling. Talking to a trusted person. Spending time with friends and family. Safety Always wear your seat belt while driving or riding in a vehicle. Do not drive: If you have been drinking alcohol. Do not ride with someone who has been drinking. When you are tired or distracted. While texting. If you have been using any  mind-altering substances or drugs. Wear a helmet and other protective equipment during sports activities. If you have firearms in your house, make sure you follow all gun safety procedures. Minimize exposure to UV radiation to reduce your risk of skin cancer. What's next? Visit your health care provider once a year for an annual wellness visit. Ask your health care provider how often you should have your eyes and teeth checked. Stay up to date on all vaccines. This information is not intended to replace advice given to you by your health care provider. Make sure you discuss any questions you have with your health care provider. Document Revised: 07/07/2020 Document Reviewed: 07/07/2020 Elsevier Patient Education  2024 [employer].

## 2023-05-04 NOTE — Assessment & Plan Note (Signed)
 Check lipids. Discussed lifestyle modifications.

## 2023-05-04 NOTE — Assessment & Plan Note (Signed)
 Check A1c.  Discussed lifestyle modifications.

## 2023-05-04 NOTE — Assessment & Plan Note (Signed)
 At goal today on amlodipine 10 mg and lisinopril - hydrochlorothiazide 20-25 once daily.

## 2023-05-04 NOTE — Assessment & Plan Note (Signed)
 Patient has been on CPAP the last 5+ years. Needs to establish with sleep specialist in the area. Currently running low on supplies though overall doing well with CPAP. Will refer to sleep medicine today.

## 2023-05-04 NOTE — Progress Notes (Signed)
 His cholesterol is elevated.  Recommend starting statin to improve numbers and low risk heart attack and stroke.  Please send in Lipitor 40 mg daily is agreeable to start.  His A1c is also mildly elevated but stable the last couple of years.  Do not restart meds for this but he should continue to work on diet and exercise and we can recheck again in a year or so.  The rest of his labs are all at goal and we can recheck in a year.

## 2023-05-04 NOTE — Assessment & Plan Note (Signed)
 Following with urology. PSA's have been at goal.

## 2023-05-04 NOTE — Assessment & Plan Note (Signed)
 Anticoagulated on warfarin.

## 2023-05-04 NOTE — Progress Notes (Signed)
 Chief Complaint:  Arthur King is a 73 y.o. male who presents today for his annual comprehensive physical exam.    Assessment/Plan:  Chronic Problems Addressed Today: OSA on CPAP Patient has been on CPAP the last 5+ years. Needs to establish with sleep specialist in the area. Currently running low on supplies though overall doing well with CPAP. Will refer to sleep medicine today.   Essential hypertension At goal today on amlodipine 10 mg and lisinopril - hydrochlorothiazide 20-25 once daily.   Hyperglycemia Check A1c. Discussed lifestyle modifications.   Prostate cancer [facility]) Following with urology. PSA's have been at goal.   Dyslipidemia Check lipids. Discussed lifestyle modifications.   Protein S deficiency (HCC) Anticoagulated on warfarin.   Preventative Healthcare: Check labs. Declined colon cancer screening. He can get shingrix and Tetanus, Diphtheria, and Pertussis (Tdap) at the pharmacy.   Patient Counseling(The following topics were reviewed and/or handout was given):  -Nutrition: Stressed importance of moderation in sodium/caffeine intake, saturated fat and cholesterol, caloric balance, sufficient intake of fresh fruits, vegetables, and fiber.  -Stressed the importance of regular exercise.   -Substance Abuse: Discussed cessation/primary prevention of tobacco, alcohol, or other drug use; driving or other dangerous activities under the influence; availability of treatment for abuse.   -Injury prevention: Discussed safety belts, safety helmets, smoke detector, smoking near bedding or upholstery.   -Sexuality: Discussed sexually transmitted diseases, partner selection, use of condoms, avoidance of unintended pregnancy and contraceptive alternatives.   -Dental health: Discussed importance of regular tooth brushing, flossing, and dental visits.  -Health maintenance and immunizations reviewed. Please refer to Health maintenance section.  Return to care in 1 year for next  preventative visit.     Subjective:  HPI:  He has no acute complaints today. See Assessment / plan for status of chronic conditions.   Lifestyle Diet: Trying to cut down on sweets and carbohydrates. Trying to get more fruits and vegetables.  Exercise: Busy around the house with yard work.      05/04/2023    7:28 AM  Depression screen PHQ 2/9  Decreased Interest 0  Down, Depressed, Hopeless 0  PHQ - 2 Score 0    Health Maintenance Due  Topic Date Due   COVID-19 Vaccine (1) Never done   Hepatitis C Screening  Never done   Fecal DNA (Cologuard)  Never done   Zoster Vaccines- Shingrix (2 of 2) 02/21/2022   DTaP/Tdap/Td (3 - Td or Tdap) 06/21/2022   Medicare Annual Wellness (AWV)  03/15/2023     ROS: Per HPI, otherwise a complete review of systems was negative.   PMH:  The following were reviewed and entered/updated in epic: Past Medical History:  Diagnosis Date   Arthritis    Childhood asthma    DVT, lower extremity, recurrent, left (HCC) 1974   Hearing loss    Hypertension    Obese    OSA on CPAP    Pre-diabetes    Prostate cancer (HCC)    Protein S deficiency (HCC)    Vitamin D deficiency    Patient Active Problem List   Diagnosis Date Noted   Dyslipidemia 02/14/2022   OSA on CPAP 02/14/2022   Lumbar disc disease 02/03/2021   Long term (current) use of anticoagulants 02/18/2020   Essential hypertension 02/16/2020   Hyperglycemia 02/16/2020   Protein S deficiency (HCC) 02/16/2020   Prostate cancer (HCC) 02/16/2020   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 No pertinent family history.  Medications- reviewed and updated Current Outpatient Medications  Medication Sig Dispense Refill   albuterol (PROVENTIL) (2.5 MG/3ML) 0.083% nebulizer solution Take 3 mLs (2.5 mg total) by nebulization every 6 (six) hours as needed for wheezing or shortness of breath. 75 mL 12   albuterol (VENTOLIN HFA) 108 (90 Base) MCG/ACT inhaler Inhale 1-2 puffs into the lungs every 6 (six) hours as needed for wheezing or shortness of breath. 90 g 0   amLODipine (NORVASC) 10 MG tablet TAKE 1 TABLET(10 MG) BY MOUTH EVERY MORNING 90 tablet 1   lisinopril-hydrochlorothiazide (ZESTORETIC) 20-25 MG tablet TAKE 1 TABLET BY MOUTH EVERY MORNING 90 tablet 1   Multiple Vitamins-Minerals (CENTRUM SILVER 50+MEN) TABS Take 1 tablet by mouth daily.     warfarin (COUMADIN) 6 MG tablet TAKE 1 TABLET BY MOUTH DAILYOR AS DIRECTED BY ANTICOAGULATION CLINIC 110 tablet 1   No current facility-administered medications for this visit.    Allergies-reviewed and updated Allergies  Allergen Reactions   Neosporin [Bacitracin-Polymyxin B] Rash    Social History   Socioeconomic History   Marital status: Married    Spouse name: Not on file   Number of children: Not on file   Years of education: Not on file   Highest education level: Bachelor's degree (e.g., BA, AB, BS)  Occupational History   Not on file  Tobacco Use   Smoking status: Never   Smokeless tobacco: Never  Vaping Use   Vaping status: Never Used  Substance and Sexual Activity   Alcohol use: Never   Drug use: Never   Sexual activity: Not on file  Other Topics Concern   Not on file  Social History Narrative   Not on file   Social Drivers of Health   Financial Resource Strain: Low Risk  (04/30/2023)   Overall Financial Resource Strain (CARDIA)    Difficulty of Paying Living Expenses: Not hard at all  Food Insecurity: No Food Insecurity (04/30/2023)   Hunger Vital Sign    Worried About Running Out of Food in the  Last Year: Never true    Ran Out of Food in the Last Year: Never true  Transportation Needs: No Transportation Needs (04/30/2023)   PRAPARE - Administrator, Civil Service (Medical): No    Lack of Transportation (Non-Medical): No  Physical Activity: Insufficiently Active (04/30/2023)   Exercise Vital Sign    Days of Exercise per Week: 2 days    Minutes of Exercise per Session: 60 min  Stress: No Stress Concern Present (04/30/2023)   [employer] of Occupational Health - Occupational Stress Questionnaire    Feeling of Stress : Not at all  Social Connections: Socially Integrated (04/30/2023)   Social Connection and Isolation Panel [NHANES]    Frequency of Communication with Friends and Family: More than three times a week    Frequency of Social Gatherings with Friends and Family: More than three times a week    Attends Religious Services: More than 4 times per year    Active Member of [employer] or Organizations: Yes  Attends Banker Meetings: More than 4 times per year    Marital Status: Married        Objective:  Physical Exam: BP 137/75   Pulse (!) 50   Temp 97.7 F (36.5 C) (Temporal)   Ht 5\' 10"  (1.778 m)   Wt 249 lb 6.4 oz (113.1 kg)   SpO2 98%   BMI 35.79 kg/m   Body mass index is 35.79 kg/m. Wt Readings from Last 3 Encounters:  05/04/23 249 lb 6.4 oz (113.1 kg)  03/14/22 245 lb 3.2 oz (111.2 kg)  02/14/22 243 lb 3.2 oz (110.3 kg)   Gen: NAD, resting comfortably HEENT: TMs normal bilaterally. OP clear. No thyromegaly noted.  CV: RRR with no murmurs appreciated Pulm: NWOB, CTAB with no crackles, wheezes, or rhonchi GI: Normal bowel sounds present. Soft, Nontender, Nondistended. MSK: no edema, cyanosis, or clubbing noted Skin: warm, dry Neuro: CN2-12 grossly intact. Strength 5/5 in upper and lower extremities. Reflexes symmetric and intact bilaterally.  Psych: Normal affect and thought content     Alika Eppes M. Jimmey Ralph, MD 05/04/2023 7:56 AM

## 2023-05-12 IMAGING — MR MR LUMBAR SPINE W/O CM
4 of 5 series · 18 of 48 positions shown · non-contrast
Comparison: None.

CLINICAL DATA: Chronic low back pain

EXAM:
MRI LUMBAR SPINE WITHOUT CONTRAST
TECHNIQUE: Multiplanar, multisequence MR imaging of the lumbar spine was
performed. No intravenous contrast was administered.

[Series 6: T2 · sagittal · 4.0mm · 0.73mm/px · 6 of 15 slices shown (1 of 2)]
[im 1/15]
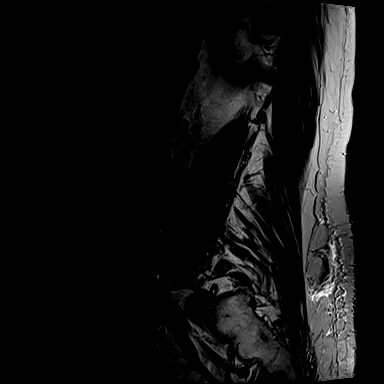
[im 3/15]
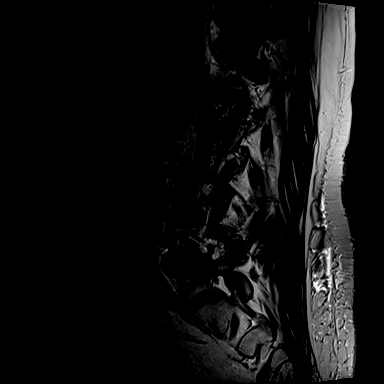
[im 6/15]
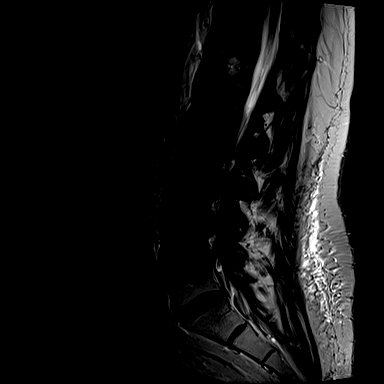
[im 9/15]
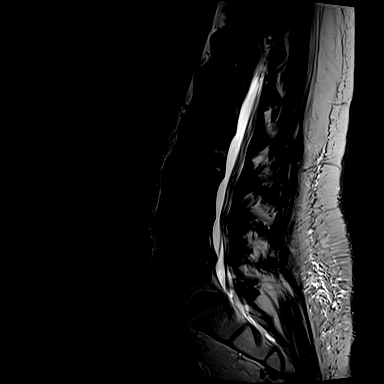
[im 12/15]
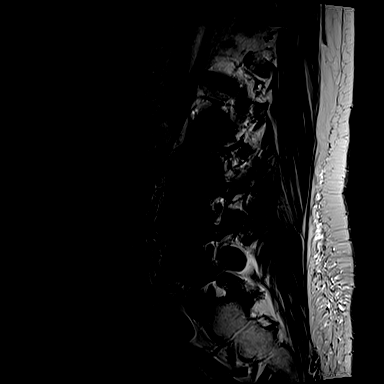
[im 15/15]
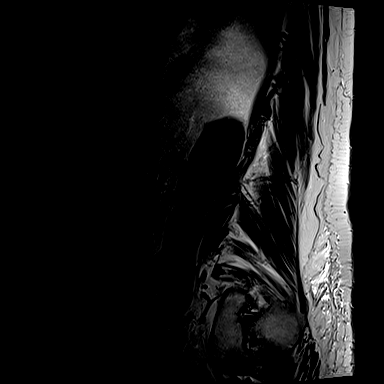

[Series 7: T1 · sagittal · 4.0mm · 0.73mm/px · 3 of 15 slices shown (1 of 2)]
[im 3/15]
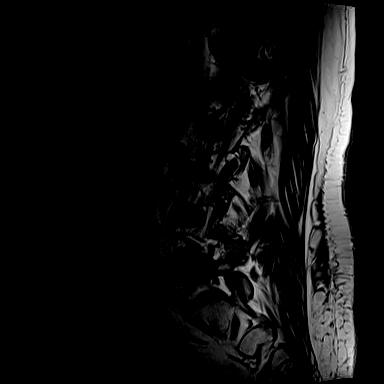
[im 9/15]
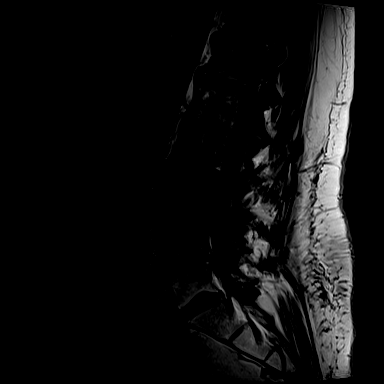
[im 15/15]
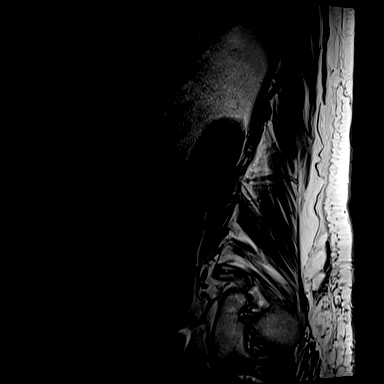

[Series 13: T2 · axial · 4.0mm · 0.28mm/px · z∈[-178,-5]mm · 6 of 39 slices shown (2 of 2)]
[im 1/39]
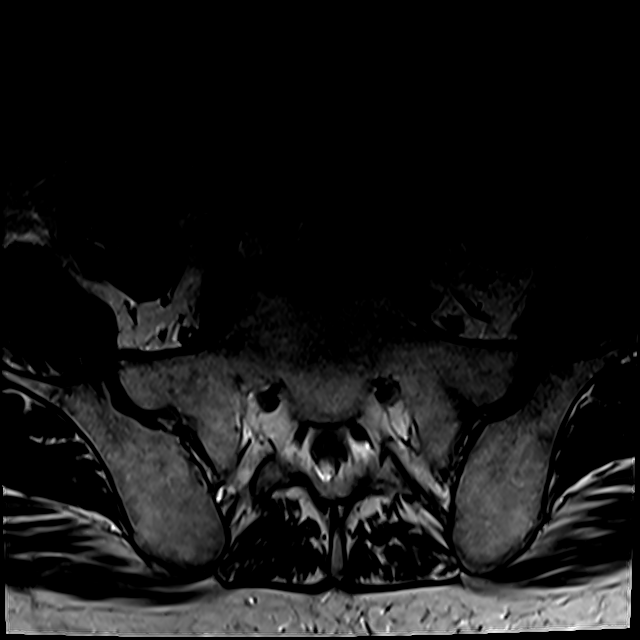
[im 6/39]
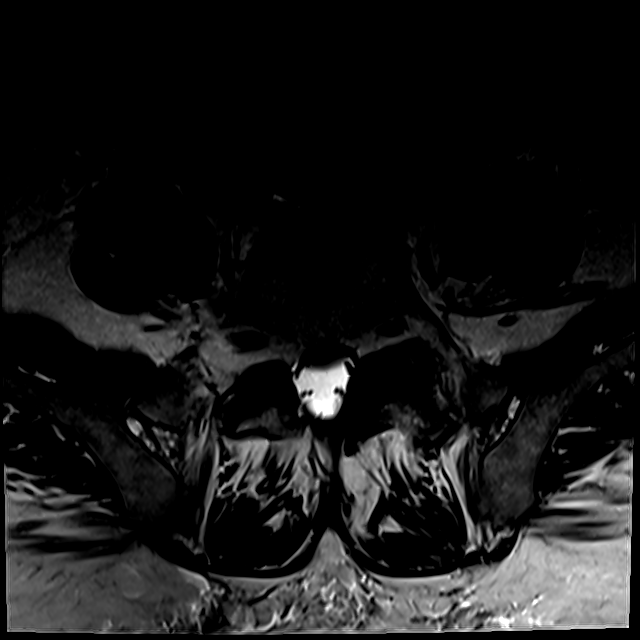
[im 11/39]
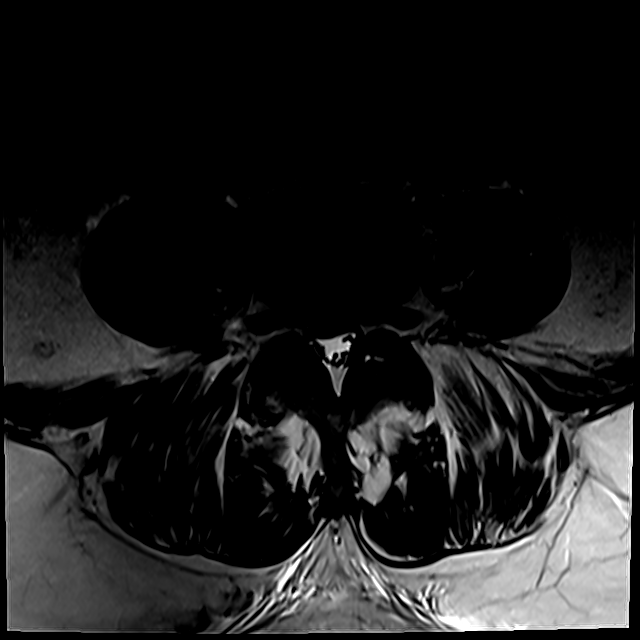
[im 17/39]
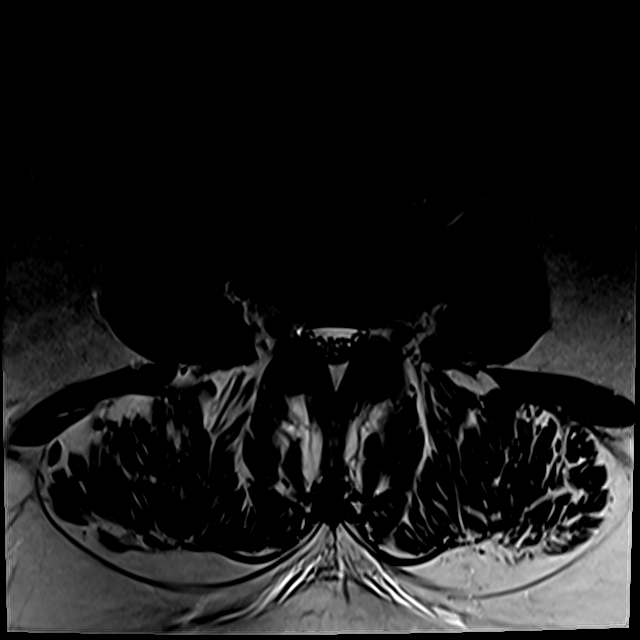
[im 20/39]
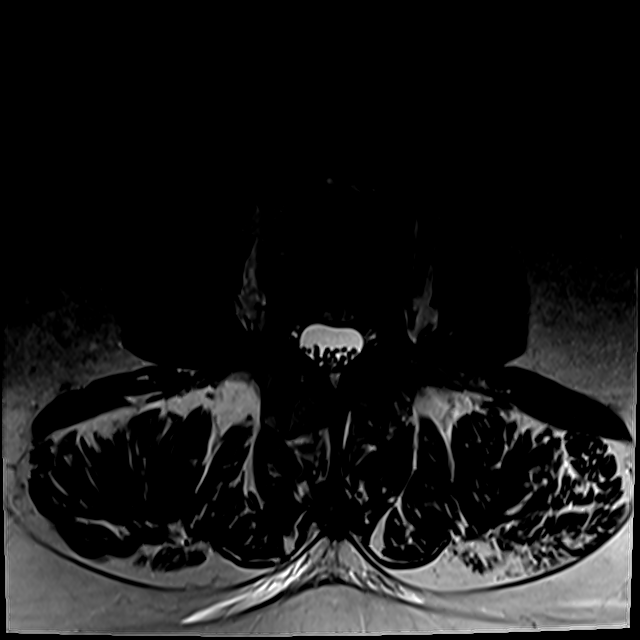
[im 33/39]
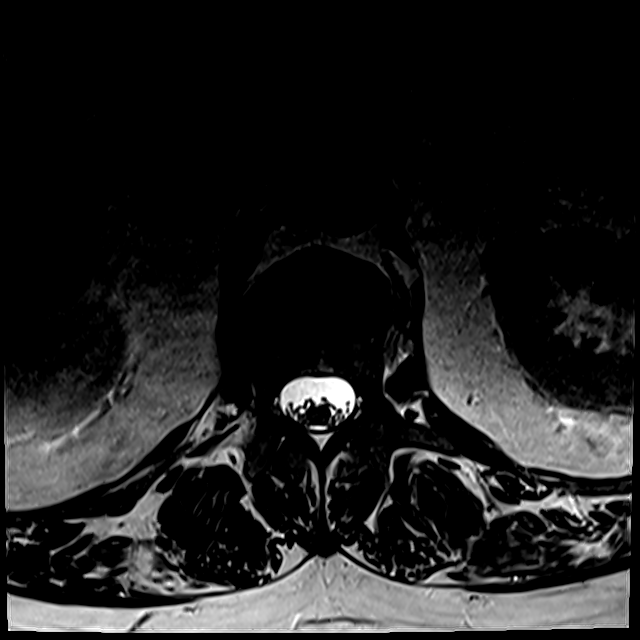

[Series 100: T1 · axial · 4.0mm · 0.28mm/px · z∈[-153,-5]mm · 3 of 39 slices shown (2 of 2)]
[im 6/39]
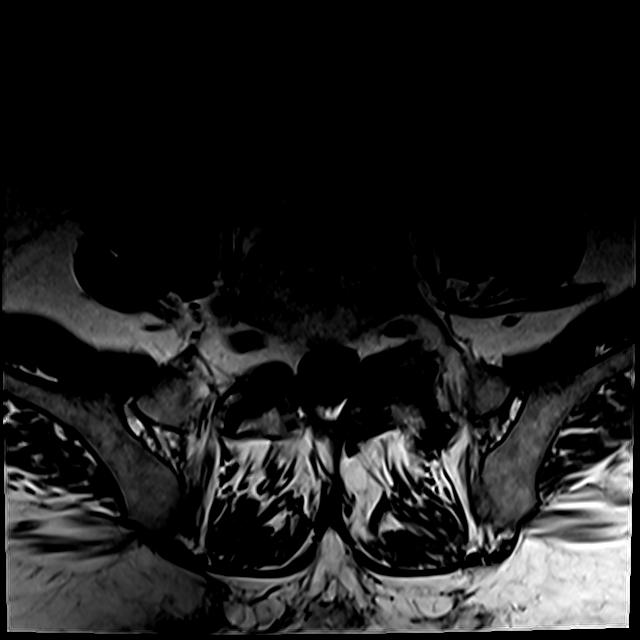
[im 20/39]
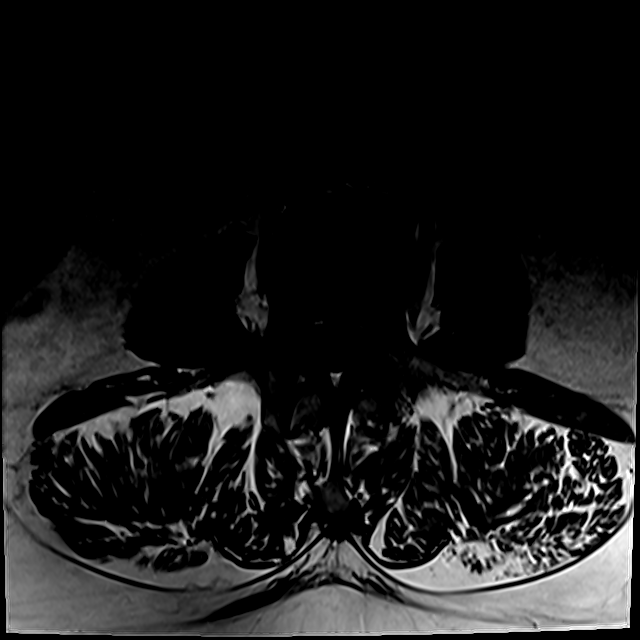
[im 33/39]
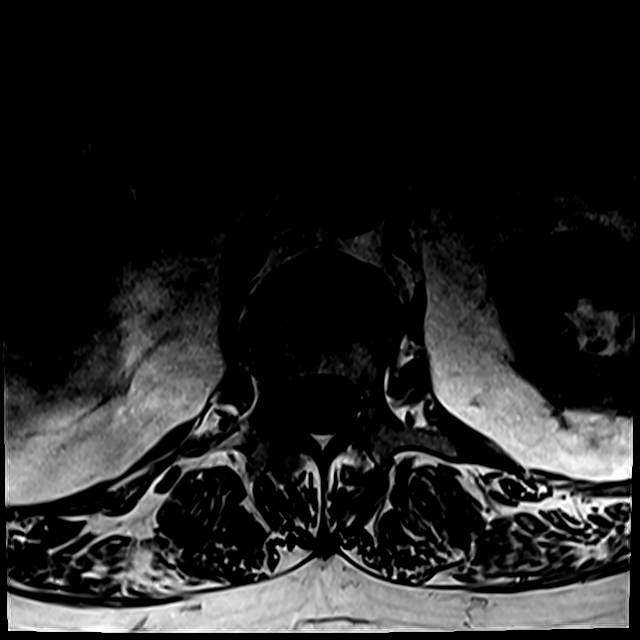

[18 of 48 positions shown; findings below may reference images not displayed]

FINDINGS: Segmentation:  Standard.

Alignment:  No significant anteroposterior listhesis.

Vertebrae: Vertebral body heights are maintained. No substantial
marrow edema. No suspicious osseous lesion.

Conus medullaris and cauda equina: Conus extends to the L1-L2 level.
Conus and cauda equina appear normal.

Paraspinal and other soft tissues: Unremarkable.

Disc levels:

L1-L2:  Minimal disc bulge.  No canal or foraminal stenosis.

L2-L3: Disc bulge slightly eccentric to the right. No canal or
foraminal stenosis.

L3-L4: Disc bulge. Moderate facet arthropathy with ligamentum flavum
infolding. Minor canal stenosis. Minor right foraminal stenosis. No
left foraminal stenosis.

L4-L5: Disc bulge with shallow central protrusion. Moderate facet
arthropathy with ligamentum flavum infolding. Mild canal stenosis.
Narrowing of the right subarticular recess. Minor foraminal
stenosis.

L5-S1: Mild right and marked left facet arthropathy. No canal or
foraminal stenosis.
IMPRESSION: Multilevel degenerative changes as detailed above without high-grade
stenosis. Lower lumbar facet arthropathy.

## 2023-05-15 ENCOUNTER — Ambulatory Visit (INDEPENDENT_AMBULATORY_CARE_PROVIDER_SITE_OTHER)

## 2023-05-15 DIAGNOSIS — Z7901 Long term (current) use of anticoagulants: Secondary | ICD-10-CM

## 2023-05-15 LAB — POCT INR: INR: 2.7 (ref 2.0–3.0)

## 2023-05-15 NOTE — Progress Notes (Signed)
 Continue 1 tablet daily. Recheck in 6 weeks.

## 2023-05-15 NOTE — Patient Instructions (Addendum)
 Pre visit review using our clinic review tool, if applicable. No additional management support is needed unless otherwise documented below in the visit note.  Continue 1 tablet daily.  Re-check in 6 weeks.

## 2023-05-31 ENCOUNTER — Ambulatory Visit: Admitting: Neurology

## 2023-05-31 ENCOUNTER — Encounter: Payer: Self-pay | Admitting: Neurology

## 2023-05-31 DIAGNOSIS — Z9989 Dependence on other enabling machines and devices: Secondary | ICD-10-CM

## 2023-05-31 DIAGNOSIS — G4733 Obstructive sleep apnea (adult) (pediatric): Secondary | ICD-10-CM

## 2023-05-31 DIAGNOSIS — R0601 Orthopnea: Secondary | ICD-10-CM | POA: Diagnosis not present

## 2023-05-31 DIAGNOSIS — Z8669 Personal history of other diseases of the nervous system and sense organs: Secondary | ICD-10-CM | POA: Diagnosis not present

## 2023-05-31 NOTE — Progress Notes (Signed)
 Arthur King

## 2023-05-31 NOTE — Progress Notes (Addendum)
 SLEEP MEDICINE CLINIC    Provider:  Neomia Banner, MD  Primary Care Physician:  Rodney Clamp, MD [address] [place] [address]     Referring Provider: Rodney Clamp, Md [address] [place],  [place] [address]          Chief Complaint according to patient   Patient presents with:     New Patient (Initial Visit) TOC of CPAP care            HISTORY OF PRESENT ILLNESS:  Arthur King is a 73 y.o. male patient who is seen upon referral on 05/31/2023 from Dr Daneil Dunker for a TOC .  Chief concern according to patient : " I need a local sleep clinic to follow. "   I have the pleasure of seeing [name] 05/31/23 a right-handed male with OSA sleep disorder.  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  Mr. Katzman reports that he had his very first sleep study in [place] at a [facility] facility and probably about 20 years ago. He reports that he didn't sleep during his sleep t study and after 3 hours the tech gave up on him- sent him home.  At the time he had to drive to [place] where he worked at the time to pick up his CPAP device.  The current CPAP machine is at least 72 years old and was issued in [place] .  He relocated just before the pandemic to this area and has not been established with the sleep clinic since.  He has been a compliant CPAP user for the last 2 decades but reports that his sleep quality has changed over the years.  He does have the additional problem of back pain which often means that he will sleep in a recliner rather than in bed, and only sleeps in the bed when he travels.  He has gained weight and he does have abdominal Gross, a larger neck circumference, irregular dentition, his body mass index currently is close to 36 kg/m.  Epworth Sleepiness Score : 1/ 24, was 22/ 24 before he used CPAP.  FSS at  13/ 63 , GDS 0/ 15     Sleep relevant  medical history: Nocturia  in the past due to prostate cancer, not now-   Tonsillectomy at age 19 , irregular dentition,  wore braces, deviated septum without repair,  Family medical /sleep history: No other family member on CPAP with OSA, no insomnia, no sleep walkers. father died at 34  , COPD Mother died at 31 , with Alzheimer's.    Social history:  Patient is retired from Counsellor and lives in a household with spouse.  Family status is married , with  2 adult children, 2 grandchildren.  Pets : new puppy. Tobacco use:  none .   ETOH use : none since college ,  Caffeine intake in form of Coffee( 5 in AM ) Soda( /) Tea ( /) no energy drinks Exercise: none regular, back pain.   Hobbies :" the grandchildren".      Sleep habits are as follows: The patient's dinner time is between 5-7 PM. The patient goes to bed at 11 PM and bedroom is cool, quiet and dark-  continues to sleep for 5-6 hours, woken by back pain, stiffness or by feeling cold -but usually sleeps through.  The preferred sleep position is  supine. Recliner , with the support of 2 pillows.  Dreams are reportedly rare.  The patient wakes up spontaneously, by 6  AM is the usual rise time. He reports feeling refreshed or restored in AM, without  symptoms such as dry mouth, morning headaches, and residual fatigue.  Naps are taken frequently, lasting from 20 to 30 minutes  while his 17 year old grandchild watches TV,.    Review of Systems: Out of a complete 14 system review, the patient complains of only the following symptoms, and all other reviewed systems are negative.:    Hearing loss,  Impulsive , logorrhea.     How likely are you to doze in the following situations: 0 = not likely, 1 = slight chance, 2 = moderate chance, 3 = high chance   Sitting and Reading? Watching Television? Sitting inactive in a public place (theater or meeting)? As a passenger in a car for an hour without a break? Lying down in the afternoon  when circumstances permit? Sitting and talking to someone? Sitting quietly after lunch without alcohol? In a car, while stopped for a few minutes in traffic?   Total = 0/ 24 points   FSS endorsed at 13/ 63 points.   Social History   Socioeconomic History   Marital status: Married    Spouse name: Not on file   Number of children: Not on file   Years of education: Not on file   Highest education level: Bachelor's degree (e.g., BA, AB, BS)  Occupational History   Not on file  Tobacco Use   Smoking status: Never   Smokeless tobacco: Never  Vaping Use   Vaping status: Never Used  Substance and Sexual Activity   Alcohol use: Never   Drug use: Never   Sexual activity: Not on file  Other Topics Concern   Not on file  Social History Narrative   Not on file   Social Drivers of Health   Financial Resource Strain: Low Risk  (04/30/2023)   Overall Financial Resource Strain (CARDIA)    Difficulty of Paying Living Expenses: Not hard at all  Food Insecurity: No Food Insecurity (04/30/2023)   Hunger Vital Sign    Worried About Running Out of Food in the Last Year: Never true    Ran Out of Food in the Last Year: Never true  Transportation Needs: No Transportation Needs (04/30/2023)   PRAPARE - Administrator, Civil Service (Medical): No    Lack of Transportation (Non-Medical): No  Physical Activity: Insufficiently Active (04/30/2023)   Exercise Vital Sign    Days of Exercise per Week: 2 days    Minutes of Exercise per Session: 60 min  Stress: No Stress Concern Present (04/30/2023)   [employer] of Occupational Health - Occupational Stress Questionnaire    Feeling of Stress : Not at all  Social Connections: Socially Integrated (04/30/2023)   Social Connection and Isolation Panel [NHANES]    Frequency of Communication with Friends and Family: More than three times a week    Frequency of Social Gatherings with Friends and Family: More than three times a week    Attends  Religious Services: More than 4 times per year    Active Member of [employer] or Organizations: Yes    Attends Engineer, structural: More than 4 times per year    Marital Status: Married    History reviewed. No pertinent family history.  Past Medical History:  Diagnosis Date   Arthritis  Childhood asthma    DVT, lower extremity, recurrent, left (HCC) 1974   Hearing loss    Hypertension    Obese    OSA on CPAP    Pre-diabetes    Prostate cancer (HCC)    Protein S deficiency (HCC)    Vitamin D deficiency     Past Surgical History:  Procedure Laterality Date   APPENDECTOMY     CHOLECYSTECTOMY     HERNIA REPAIR     Abdominal   toe nail removal Bilateral    TONSILLECTOMY     TOTAL KNEE ARTHROPLASTY Left 12/29/2019   Procedure: TOTAL KNEE ARTHROPLASTY;  Surgeon: Christie Cox, MD;  Location: [facility];  Service: Orthopedics;  Laterality: Left;   VASECTOMY     VEIN LIGATION AND STRIPPING Left      Current Outpatient Medications on File Prior to Visit  Medication Sig Dispense Refill   albuterol  (PROVENTIL ) (2.5 MG/3ML) 0.083% nebulizer solution Take 3 mLs (2.5 mg total) by nebulization every 6 (six) hours as needed for wheezing or shortness of breath. 75 mL 12   albuterol  (VENTOLIN  HFA) 108 (90 Base) MCG/ACT inhaler Inhale 1-2 puffs into the lungs every 6 (six) hours as needed for wheezing or shortness of breath. 90 g 0   amLODipine  (NORVASC ) 10 MG tablet TAKE 1 TABLET(10 MG) BY MOUTH EVERY MORNING 90 tablet 1   lisinopril -hydrochlorothiazide  (ZESTORETIC ) 20-25 MG tablet TAKE 1 TABLET BY MOUTH EVERY MORNING 90 tablet 1   Multiple Vitamins-Minerals (CENTRUM SILVER 50+MEN) TABS Take 1 tablet by mouth daily.     warfarin (COUMADIN ) 6 MG tablet TAKE 1 TABLET BY MOUTH DAILYOR AS DIRECTED BY ANTICOAGULATION CLINIC 110 tablet 1   No current facility-administered medications on file prior to visit.    Allergies  Allergen Reactions   Neosporin [Bacitracin-Polymyxin B] Rash      DIAGNOSTIC DATA (LABS, IMAGING, TESTING) - I reviewed patient records, labs, notes, testing and imaging myself where available.  Lab Results  Component Value Date   WBC 5.0 05/04/2023   HGB 16.2 05/04/2023   HCT 48.1 05/04/2023   MCV 91.0 05/04/2023   PLT 192.0 05/04/2023      Component Value Date/Time   NA 139 05/04/2023 0839   K 4.3 05/04/2023 0839   CL 101 05/04/2023 0839   CO2 28 05/04/2023 0839   GLUCOSE 114 (H) 05/04/2023 0839   BUN 22 05/04/2023 0839   CREATININE 0.94 05/04/2023 0839   CALCIUM 9.4 05/04/2023 0839   PROT 7.6 05/04/2023 0839   ALBUMIN 4.8 05/04/2023 0839   AST 26 05/04/2023 0839   ALT 27 05/04/2023 0839   ALKPHOS 42 05/04/2023 0839   BILITOT 0.4 05/04/2023 0839   GFRNONAA >60 12/30/2019 0330   Lab Results  Component Value Date   CHOL 216 (H) 05/04/2023   HDL 39.50 05/04/2023   LDLCALC 126 (H) 05/04/2023   LDLDIRECT 128.0 02/14/2022   TRIG 255.0 (H) 05/04/2023   CHOLHDL 5 05/04/2023   Lab Results  Component Value Date   HGBA1C 6.1 05/04/2023   No results found for: "VITAMINB12" Lab Results  Component Value Date   TSH 2.10 05/04/2023    PHYSICAL EXAM:  Today's Vitals   05/31/23 1003  BP: 130/62  Pulse: 66  Weight: 249 lb 9.6 oz (113.2 kg)  Height: 5\' 10"  (1.778 m)   Body mass index is 35.81 kg/m.   Wt Readings from Last 3 Encounters:  05/31/23 249 lb 9.6 oz (113.2 kg)  05/04/23 249 lb 6.4 oz (  113.1 kg)  03/14/22 245 lb 3.2 oz (111.2 kg)     Ht Readings from Last 3 Encounters:  05/31/23 5\' 10"  (1.778 m)  05/04/23 5\' 10"  (1.778 m)  02/14/22 5\' 10"  (1.778 m)      General: The patient is awake, alert and appears not in acute distress. The patient is well groomed. Head: Normocephalic, atraumatic. Neck is supple.  Mallampati between 2-3 , with lateral crowding and low setting.,  neck circumference:19 inches . Nasal airflow never fully patent.  Retrognathia is  seen.  Overbite and irregular teeth, poor   Cardiovascular:   Regular rate and cardiac rhythm by pulse,  without distended neck veins. Respiratory: Lungs are clear to auscultation.  Skin:  With evidence of ankle edema, no rash. Has a history of foot ulcers,  Trunk: abdominal obesity    NEUROLOGIC EXAM: The patient is awake and alert, oriented to place and time.   Memory subjective described as intact.  Attention span & concentration ability appears normal.  Speech is fluent,  without  dysarthria, dysphonia or aphasia.  Mood and affect are appropriate.   Cranial nerves: no loss of smell or taste reported  Pupils are equal and briskly reactive to light. Funduscopic exam def.  Extraocular movements in vertical and horizontal planes were intact and without nystagmus.  No Diplopia. Visual fields by finger perimetry are intact. Hearing was intact to soft voice and finger rubbing.    Facial sensation intact to fine touch.  Facial motor strength is symmetric and tongue and uvula move midline.  Neck ROM : rotation, tilt and flexion extension were normal for age and shoulder shrug was symmetrical.    Motor exam:  Symmetric bulk, tone and ROM.   Normal tone without cog wheeling, symmetric grip strength .   Sensory:  Vibration was not felt at the ankles.  Proprioception tested in the upper extremities was normal.   Coordination: Rapid alternating movements in the fingers/hands were of normal speed.  The Finger-to-nose maneuver was intact without evidence of ataxia, dysmetria or tremor.   Gait and station: Patient could rise unassisted from a seated position, walked without assistive device.  He has back stiffness.  Stance is of normal width/ base and the patient turned with 4 steps.  Toe and heel walk were deferred.  Deep tendon reflexes: in the  upper and lower extremities are symmetrically attenuated      ASSESSMENT AND PLAN 73 y.o. year old male  patient with hearing impairment , here to establish care  for OSA on CPAP . This patient who has a  multitude of existing risk factors for both forms of apnea, OSA and CSA. No sleep study in 20 years.     1)  Obesity,  BMI of 35.8 , with ankle edema, delayed healing of foot ulcers.   2) large neck  (19) and small airway. Has irregular teeth, small mouth .   3)  Protein S deficiency and chronically anticoagulated - HTN, not diabetic.  Chronic n back pain, prefers to sleep in a recliner, also helps breathing to be reclined. No GERD.   Plan to obtain a NEW sleep study by Rinda Cheers , patient's preference   He uses a res med machine and  a FFM Resmed, setting 15 -20 cm water  with 95% pressure at 18 cm water . Mouth breather.    I plan to follow up either personally or through our NP within 3-5 months.   I would like to thank Rodney Clamp,  MD and Rodney Clamp, Md [address] [place],  [place] [address] for allowing me to meet with and to take care of this pleasant patient.  After spending a total time of  45  minutes face to face and additional time for physical and neurologic examination, review of laboratory studies,  personal review of imaging studies, reports and results of other testing and review of referral information / records as far as provided in visit,   Electronically signed by: Neomia Banner, MD 05/31/2023 10:12 AM  [facility] Board certified by The [employer] of Sleep Medicine and Diplomate of the [employer] of Sleep Medicine. Board certified In Neurology through the ABPN, Fellow of the [employer] of Neurology.  Attcah the download, please.

## 2023-05-31 NOTE — Patient Instructions (Signed)
 Sleep Apnea Test: What to Expect  Sleep apnea is a condition that affects your breathing while you're sleeping. You may have shallow breathing or stop breathing for short periods of time. Sleep apnea screening is a test to check if you're at risk for sleep apnea. The test includes questions. It will only takes a few minutes. Your health care provider may ask you to have this test before a surgery or as part of a physical exam. What are the symptoms of sleep apnea? Snoring. Waking up often at night. Daytime sleepiness. Pauses in breathing. Choking or gasping during sleep. Being annoyed easily. Forgetfulness. Trouble thinking clearly. Depression. Personality changes. Headaches in the morning. Most people with sleep apnea do not know that they have it. What are the advantages of sleep apnea screening? Getting screened for sleep apnea can help: Keep you safer. Your providers need to know whether or not you have sleep apnea, especially if you're having surgery or have other long-term, or chronic, health conditions. Improve your health and help you get a better night's rest. Restful sleep can help you: Have more energy. Lose weight. Improve high blood pressure. Improve diabetes management. Prevent stroke. Prevent car accidents. What happens before the screening? You may talk with your provider about the screening and what other tests may be recommended based on the screening. What happens during the screening? Screening usually includes being asked a list of questions about your sleep quality. Some questions you may be asked include: Do you snore? Is your sleep restless? Do you have daytime sleepiness? Has a partner or spouse told you that you stop breathing, choke, or gasp during sleep? Have you had trouble concentrating or memory loss? What is your age? What is your neck circumference? To measure your neck, keep your back straight and gently wrap the tape measure around your neck.  Put the tape measure at the middle of your neck, between your chin and collarbone. What is your sex assigned at birth? Do you have high blood pressure or are you being treated for high blood pressure? If your screening test is positive, you're at risk for the condition. More tests may be needed to confirm a diagnosis of sleep apnea. What can I expect after the screening? Your provider will go over the results of the screening with you and make recommendations based on the results of the test. Where to find more information You can find screening tools online or at your health care clinic. To learn more, go to these websites: Centers for Disease Control and Prevention: DiningCalendar.de. Then: Click Health Topics A-Z. Type "sleep apnea" in the search box. National Heart, Lung, and Blood Institute: BuffaloDryCleaner.gl Contact a health care provider if: You think that you may have sleep apnea. This information is not intended to replace advice given to you by your health care provider. Make sure you discuss any questions you have with your health care provider. Document Revised: 06/17/2022 Document Reviewed: 06/17/2022 Elsevier Patient Education  2024 [employer].

## 2023-06-04 ENCOUNTER — Telehealth: Payer: Self-pay | Admitting: Neurology

## 2023-06-04 NOTE — Telephone Encounter (Signed)
 HTA HST pending

## 2023-06-05 ENCOUNTER — Other Ambulatory Visit: Payer: Self-pay | Admitting: *Deleted

## 2023-06-05 DIAGNOSIS — I10 Essential (primary) hypertension: Secondary | ICD-10-CM

## 2023-06-05 MED ORDER — LISINOPRIL-HYDROCHLOROTHIAZIDE 20-25 MG PO TABS: 1.0000 | ORAL_TABLET | ORAL | 1 refills | Status: AC

## 2023-06-05 MED ORDER — AMLODIPINE BESYLATE 10 MG PO TABS: ORAL_TABLET | ORAL | 1 refills | Status: AC

## 2023-06-12 NOTE — Telephone Encounter (Signed)
 HST HTA Siegfried Dress: [id number] (exp. 06/04/23 to 08/04/23)

## 2023-06-26 ENCOUNTER — Ambulatory Visit (INDEPENDENT_AMBULATORY_CARE_PROVIDER_SITE_OTHER)

## 2023-06-26 DIAGNOSIS — Z7901 Long term (current) use of anticoagulants: Secondary | ICD-10-CM

## 2023-06-26 LAB — POCT INR: INR: 2.3 (ref 2.0–3.0)

## 2023-06-26 NOTE — Patient Instructions (Addendum)
 Pre visit review using our clinic review tool, if applicable. No additional management support is needed unless otherwise documented below in the visit note.  Continue 1 tablet daily.  Re-check in 6 weeks.

## 2023-06-26 NOTE — Progress Notes (Signed)
 Continue 1 tablet daily. Recheck in 6 weeks.

## 2023-07-03 ENCOUNTER — Ambulatory Visit (INDEPENDENT_AMBULATORY_CARE_PROVIDER_SITE_OTHER): Admitting: Neurology

## 2023-07-03 DIAGNOSIS — G4733 Obstructive sleep apnea (adult) (pediatric): Secondary | ICD-10-CM | POA: Diagnosis not present

## 2023-07-03 DIAGNOSIS — R0601 Orthopnea: Secondary | ICD-10-CM

## 2023-07-03 DIAGNOSIS — Z8669 Personal history of other diseases of the nervous system and sense organs: Secondary | ICD-10-CM

## 2023-07-03 DIAGNOSIS — Z9989 Dependence on other enabling machines and devices: Secondary | ICD-10-CM

## 2023-07-04 NOTE — Progress Notes (Signed)
 [facility]   HOME SLEEP TEST REPORT ( by Watch PAT)   STUDY DATE:   DOB:   MRN:    ORDERING CLINICIAN:  REFERRING CLINICIAN:    CLINICAL INFORMATION/HISTORY: Arthur King is a 73 y.o. male patient who is seen upon referral on 05/31/2023 from Dr Daneil Dunker for a TOC .  Chief concern according to patient :  I need a local sleep clinic to follow.    I have the pleasure of seeing Arthur King 05/31/23 a right-handed male with OSA sleep disorder.  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  Mr. Sanfilippo reports that he had his very first sleep study in [place] at a [facility] facility and probably about 20 years ago.  The current CPAP machine is at least 73 years old and was issued in [place] .  He relocated just before the pandemic to this area and has not been established with the sleep clinic since.  He has been a compliant CPAP user for the last 2 decades but reports that his sleep quality has changed over the years.  He does have the additional problem of back pain which often means that he will sleep in a recliner rather than in bed, and only sleeps in the bed when he travels. his body mass index currently is close to 36 kg/m.   Epworth Sleepiness Score : 1/ 24, was 22/ 24 before he used CPAP.  FSS at  13/ 63 , GDS 0/ 15      Epworth sleepiness score: /24.   Body mass index is 35.81 kg/m.    Neck Circumference: 19    FINDINGS:   Sleep Summary:   Total Recording Time (hours, min):   7 hours 55 minutes  Total Sleep Time (hours, min):     6 hours 37 minutes            Percent REM (%):    13.3%                                    Respiratory Indices:   Calculated pAHI (per CMS guideline): 13.5/h REM pAHI:     41.7/h !!!                                            NREM pAHI:      9.2/h                        Positional AHI:    All sleep in  supine  Snoring:     Mean volume of 42 dB                                           Oxygen Saturation Statistics:   Oxygen Saturation (%) Mean:      93%          O2 Saturation Range (%):       Between the nadir at 79% and a maximum saturation of 98%  O2 Saturation (minutes) <89%: 9.1-minute         Pulse Rate Statistics:   Pulse Mean (bpm):        54 bpm         Pulse Range:    Between 40 to and 90 bpm.  Please note that the home sleep test device cannot give cardiac rhythm data at this point.             IMPRESSION:  This HST confirms the presence of mild to moderate and all obstructive sleep apnea.  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 REM sleep dependent apnea cannot be treated by dental devices or hypoglossal nerve stimulators neither can hypoxia be corrected by either of these steps.  The patient will have to remain on positive airway pressure therapy.  Weight loss is recommended as it will reduce snoring and can reduce the number of apnea.   RECOMMENDATION: Continuation of positive airway pressure therapy is recommended. I recommend to start this patient on BiPAP as he is straddles the upper limits of pressure that his CPAP can provide. I hope that helps team advantage allows me to prescribe a BiPAP machine with a pressure between 16/12 and 24 /20 cm water  pressure, heated humidification and interface of patient's choice and comfort.  If this should not be possible due to insurance denial, I will order the exact same settings he is currently using of CPAP.  12 through 20 cm water  pressure, 2 cm EPR, heated humidification.    INTERPRETING PHYSICIAN:   Neomia Banner, MD  [facility] and [facility] Sleep Board certified by The [employer] of Sleep Medicine and Diplomate of the [employer] of Sleep Medicine. Board certified In Neurology through the ABPN,  Fellow of the [employer] of Neurology.

## 2023-07-08 ENCOUNTER — Ambulatory Visit: Payer: Self-pay | Admitting: Neurology

## 2023-07-08 DIAGNOSIS — Z8669 Personal history of other diseases of the nervous system and sense organs: Secondary | ICD-10-CM

## 2023-07-08 DIAGNOSIS — R0601 Orthopnea: Secondary | ICD-10-CM

## 2023-07-08 DIAGNOSIS — Z9989 Dependence on other enabling machines and devices: Secondary | ICD-10-CM

## 2023-07-08 NOTE — Procedures (Signed)
 [facility]   HOME SLEEP TEST REPORT ( by Watch PAT)   STUDY DATE:   DOB:   MRN:    ORDERING CLINICIAN:  REFERRING CLINICIAN:    CLINICAL INFORMATION/HISTORY: Arthur King is a 73 y.o. male patient who is seen upon referral on 05/31/2023 from Dr Arthur King for a TOC .  Chief concern according to patient :  I need a local sleep clinic to follow.    I have the pleasure of seeing Arthur King 05/31/23 a right-handed male with OSA sleep disorder.  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  Arthur King reports that he had his very first sleep study in [place] at a [facility] facility and probably about 20 years ago.  The current CPAP machine is at least 73 years old and was issued in [place] .  He relocated just before the pandemic to this area and has not been established with the sleep clinic since.  He has been a compliant CPAP user for the last 2 decades but reports that his sleep quality has changed over the years.  He does have the additional problem of back pain which often means that he will sleep in a recliner rather than in bed, and only sleeps in the bed when he travels. his body mass index currently is close to 36 kg/m.   Epworth Sleepiness Score : 1/ 24, was 22/ 24 before he used CPAP.  FSS at  13/ 63 , GDS 0/ 15      Epworth sleepiness score: /24.   Body mass index is 35.81 kg/m.    Neck Circumference: 19    FINDINGS:   Sleep Summary:   Total Recording Time (hours, min):   7 hours 55 minutes  Total Sleep Time (hours, min):     6 hours 37 minutes            Percent REM (%):    13.3%                                    Respiratory Indices:   Calculated pAHI (per CMS guideline): 13.5/h REM pAHI:     41.7/h !!!                                            NREM pAHI:      9.2/h                        Positional AHI:    All sleep in  supine  Snoring:     Mean volume of 42 dB                                           Oxygen Saturation Statistics:   Oxygen Saturation (%) Mean:      93%          O2 Saturation Range (%):       Between the nadir at 79% and a maximum saturation of 98%  O2 Saturation (minutes) <89%: 9.1-minute         Pulse Rate Statistics:   Pulse Mean (bpm):        54 bpm         Pulse Range:    Between 40 to and 90 bpm.  Please note that the home sleep test device cannot give cardiac rhythm data at this point.             IMPRESSION:  This HST confirms the presence of mild to moderate and all obstructive sleep apnea.  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 REM sleep dependent apnea cannot be treated by dental devices or hypoglossal nerve stimulators neither can hypoxia be corrected by either of these steps.  The patient will have to remain on positive airway pressure therapy.  Weight loss is recommended as it will reduce snoring and can reduce the number of apnea.   RECOMMENDATION: Continuation of positive airway pressure therapy is recommended. I recommend to start this patient on BiPAP as he is straddles the upper limits of pressure that his CPAP can provide. I hope that helps team advantage allows me to prescribe a BiPAP machine with a pressure between 16/12 and 24 /20 cm water  pressure, heated humidification and interface of patient's choice and comfort.  If this should not be possible due to insurance denial, I will order the exact same settings he is currently using of CPAP.  12 through 20 cm water  pressure, 2 cm EPR, heated humidification.    INTERPRETING PHYSICIAN:   Arthur Banner, MD  [facility] and [facility] Sleep Board certified by The [employer] of Sleep Medicine and Diplomate of the [employer] of Sleep Medicine. Board certified In Neurology through the ABPN,  Fellow of the [employer] of Neu[employer].

## 2023-07-10 NOTE — Telephone Encounter (Signed)
-----   Message from Charlotte Hall Dohmeier sent at 07/08/2023  9:31 PM EDT ----- This HST confirms the presence of mild to moderate and all -obstructive sleep apnea. This is associated with a moderate degree of hypoxia and is a strongly REM sleep dependent form of apnea.   Continuation of positive airway pressure therapy is recommended. I recommend to start this patient on BiPAP as he is straddles the upper limits of pressure that his CPAP can provide. I hope that helps team advantage allows me to prescribe a BiPAP machine with a pressure between 16/12 and 24 /20 cm water  pressure, heated humidification and interface of patient's choice and  comfort.   If this should not be possible due to insurance denial, I will order the exact same settings he is currently using of CPAP.  12 through 20 cm water  pressure, 2 cm EPR, heated humidification. ----- Message ----- From: Neomia Banner, MD Sent: 07/08/2023   9:29 PM EDT To: Neomia Banner, MD

## 2023-07-10 NOTE — Telephone Encounter (Signed)
 I called pt. I advised pt that Dr. Albertina Hugger reviewed their sleep study results and found that pt has sleep apnea and should continue CPAP. Dr. Albertina Hugger recommends that pt starts auto CPAP. I reviewed PAP compliance expectations with the pt. Pt is agreeable to starting a CPAP. I advised pt that an order will be sent to a DME, [facility], and [facility] will call the pt within about one week after they file with the pt's insurance. [facility] will show the pt how to use the machine, fit for masks, and troubleshoot the CPAP if needed. A follow up appt will be need to made for insurance purposes with Dr. Albertina Hugger or NP. Pt verbalized understanding of results. Pt had no questions at this time but was encouraged to call back if questions arise. I have sent the order to [facility] and have received confirmation that they have received the order.

## 2023-07-12 NOTE — Telephone Encounter (Signed)
 Zott, Shireen Dory, Kathaleen Pale, RN; Carlton, Ammon Bales; [place], [place] Got It Thank Elene Griffes

## 2023-07-31 ENCOUNTER — Other Ambulatory Visit: Payer: Self-pay | Admitting: *Deleted

## 2023-07-31 DIAGNOSIS — G4733 Obstructive sleep apnea (adult) (pediatric): Secondary | ICD-10-CM | POA: Diagnosis not present

## 2023-07-31 DIAGNOSIS — Z7901 Long term (current) use of anticoagulants: Secondary | ICD-10-CM

## 2023-07-31 NOTE — Telephone Encounter (Signed)
 [facility] requesting refill Rx Warfarin 6mg 

## 2023-08-01 MED ORDER — WARFARIN SODIUM 6 MG PO TABS: ORAL_TABLET | ORAL | 1 refills | Status: DC

## 2023-08-01 NOTE — Telephone Encounter (Signed)
 Pt is compliant with warfarin management and PCP apts.  Sent in refill of warfarin to requested pharmacy.

## 2023-08-02 ENCOUNTER — Telehealth: Payer: Self-pay | Admitting: Neurology

## 2023-08-02 NOTE — Telephone Encounter (Signed)
 Called Pt , No answer , LVM  to callback to schedule initial Cpap appt  between (08/09-10/8)

## 2023-08-03 ENCOUNTER — Telehealth: Payer: Self-pay | Admitting: Neurology

## 2023-08-03 NOTE — Telephone Encounter (Signed)
 Pt has scheduled his initial CPAP f/u, pt aware to bring CPAP and power cord

## 2023-08-07 ENCOUNTER — Ambulatory Visit (INDEPENDENT_AMBULATORY_CARE_PROVIDER_SITE_OTHER)

## 2023-08-07 DIAGNOSIS — Z7901 Long term (current) use of anticoagulants: Secondary | ICD-10-CM | POA: Diagnosis not present

## 2023-08-07 LAB — POCT INR: INR: 2.2 (ref 2.0–3.0)

## 2023-08-07 NOTE — Patient Instructions (Addendum)
 Pre visit review using our clinic review tool, if applicable. No additional management support is needed unless otherwise documented below in the visit note.  Continue 1 tablet daily.  Re-check in 6 weeks.

## 2023-08-07 NOTE — Progress Notes (Signed)
 Continue 1 tablet daily. Recheck in 6 weeks.

## 2023-08-31 DIAGNOSIS — G4733 Obstructive sleep apnea (adult) (pediatric): Secondary | ICD-10-CM | POA: Diagnosis not present

## 2023-09-14 ENCOUNTER — Ambulatory Visit: Admitting: Neurology

## 2023-09-18 ENCOUNTER — Ambulatory Visit (INDEPENDENT_AMBULATORY_CARE_PROVIDER_SITE_OTHER)

## 2023-09-18 DIAGNOSIS — Z7901 Long term (current) use of anticoagulants: Secondary | ICD-10-CM | POA: Diagnosis not present

## 2023-09-18 LAB — POCT INR: INR: 2.4 (ref 2.0–3.0)

## 2023-09-18 NOTE — Patient Instructions (Addendum)
 Pre visit review using our clinic review tool, if applicable. No additional management support is needed unless otherwise documented below in the visit note.  Continue 1 tablet daily.  Re-check in 6 weeks.

## 2023-09-18 NOTE — Progress Notes (Signed)
 Continue 1 tablet daily. Recheck in 6 weeks.

## 2023-09-20 NOTE — Progress Notes (Signed)
 [facility] [address] [place] [place] [address] [phone]       OFFICE FOLLOW UP NOTE  [name] Date of Birth:  27-Apr-1950 Medical Record Number:  [id number]    Primary neurologist: Dr. Chalice Reason for visit: Initial CPAP follow-up    SUBJECTIVE:   CHIEF COMPLAINT:  Chief Complaint  Patient presents with   Sleep Apnea    Rm 3 alone Pt is well and stable, reports no concerns with OSA/CPAP     Follow-up visit:  Prior visit: 05/31/2023 with Dr. Chalice (initial consult visit)  Brief HPI:   Arthur King is a 73 y.o. male who was evaluated by Dr. Chalice in 05/2023 to establish care for management of CPAP for OSA.  Reported initial sleep study through [facility] around 20 years prior.  Current CPAP device around 73 years old and issued while living in [place] .  HST 06/2023 showed mild to moderate OSA accentuated during REM sleep with total AHI of 13.5/h, REM AHI of 41.7/h and O2 nadir 79%.  Received new AutoPap machine 07/2023 on same settings of 12-20 with EPR 2.    Interval history:  Patient returns for initial CPAP compliance visit.  Reports overall doing well with current CPAP machine.  Currently using a fullface mask, at times can cause irritation on the bridge of his nose or have leak but since adjusting sizes, this has greatly improved.  Reports he sleeps well and denies any significant daytime fatigue. ESS 1/24.            ROS:   14 system review of systems performed and negative with exception of those listed in HPI  PMH:  Past Medical History:  Diagnosis Date   Arthritis    Childhood asthma    DVT, lower extremity, recurrent, left (HCC) 1974   Hearing loss    Hypertension    Obese    OSA on CPAP    Pre-diabetes    Prostate cancer (HCC)    Protein S deficiency (HCC)    Vitamin D deficiency     PSH:  Past Surgical History:  Procedure Laterality Date   APPENDECTOMY     CHOLECYSTECTOMY     HERNIA  REPAIR     Abdominal   toe nail removal Bilateral    TONSILLECTOMY     TOTAL KNEE ARTHROPLASTY Left 12/29/2019   Procedure: TOTAL KNEE ARTHROPLASTY;  Surgeon: Rubie Kemps, MD;  Location: [facility];  Service: Orthopedics;  Laterality: Left;   VASECTOMY     VEIN LIGATION AND STRIPPING Left     Social History:  Social History   Socioeconomic History   Marital status: Married    Spouse name: Not on file   Number of children: Not on file   Years of education: Not on file   Highest education level: Bachelor's degree (e.g., BA, AB, BS)  Occupational History   Not on file  Tobacco Use   Smoking status: Never   Smokeless tobacco: Never  Vaping Use   Vaping status: Never Used  Substance and Sexual Activity   Alcohol use: Never   Drug use: Never   Sexual activity: Not on file  Other Topics Concern   Not on file  Social History Narrative   Not on file   Social Drivers of Health   Financial Resource Strain: Low Risk  (04/30/2023)   Overall Financial Resource Strain (CARDIA)    Difficulty of Paying Living Expenses: Not hard at all  Food Insecurity: No  Food Insecurity (04/30/2023)   Hunger Vital Sign    Worried About Running Out of Food in the Last Year: Never true    Ran Out of Food in the Last Year: Never true  Transportation Needs: No Transportation Needs (04/30/2023)   PRAPARE - Administrator, Civil Service (Medical): No    Lack of Transportation (Non-Medical): No  Physical Activity: Insufficiently Active (04/30/2023)   Exercise Vital Sign    Days of Exercise per Week: 2 days    Minutes of Exercise per Session: 60 min  Stress: No Stress Concern Present (04/30/2023)   [employer] of Occupational Health - Occupational Stress Questionnaire    Feeling of Stress : Not at all  Social Connections: Socially Integrated (04/30/2023)   Social Connection and Isolation Panel    Frequency of Communication with Friends and Family: More than three times a week    Frequency of Social  Gatherings with Friends and Family: More than three times a week    Attends Religious Services: More than 4 times per year    Active Member of [employer] or Organizations: Yes    Attends Banker Meetings: More than 4 times per year    Marital Status: Married  Catering manager Violence: Not At Risk (03/14/2022)   Humiliation, Afraid, Rape, and Kick questionnaire    Fear of Current or Ex-Partner: No    Emotionally Abused: No    Physically Abused: No    Sexually Abused: No    Family History: History reviewed. No pertinent family history.  Medications:   Current Outpatient Medications on File Prior to Visit  Medication Sig Dispense Refill   albuterol  (PROVENTIL ) (2.5 MG/3ML) 0.083% nebulizer solution Take 3 mLs (2.5 mg total) by nebulization every 6 (six) hours as needed for wheezing or shortness of breath. 75 mL 12   albuterol  (VENTOLIN  HFA) 108 (90 Base) MCG/ACT inhaler Inhale 1-2 puffs into the lungs every 6 (six) hours as needed for wheezing or shortness of breath. 90 g 0   amLODipine  (NORVASC ) 10 MG tablet TAKE 1 TABLET(10 MG) BY MOUTH EVERY MORNING 90 tablet 1   lisinopril -hydrochlorothiazide  (ZESTORETIC ) 20-25 MG tablet Take 1 tablet by mouth every morning. 90 tablet 1   Multiple Vitamins-Minerals (CENTRUM SILVER 50+MEN) TABS Take 1 tablet by mouth daily.     warfarin (COUMADIN ) 6 MG tablet TAKE 1 TABLET BY MOUTH DAILYOR AS DIRECTED BY ANTICOAGULATION CLINIC 110 tablet 1   No current facility-administered medications on file prior to visit.    Allergies:   Allergies  Allergen Reactions   Neosporin [Bacitracin-Polymyxin B] Rash      OBJECTIVE:  Physical Exam  Vitals:   09/25/23 1435 09/25/23 1455  BP: (!) 172/86 132/62  Pulse: 68   Weight: 247 lb (112 kg)   Height: 5' 10 (1.778 m)    Body mass index is 35.44 kg/m. No results found.  General: well developed, well nourished, very pleasant elderly Caucasian male, seated, in no evident distress Head: head  normocephalic and atraumatic.   Neck: supple with no carotid or supraclavicular bruits Cardiovascular: regular rate and rhythm, no murmurs  Neurologic Exam Mental Status: Awake and fully alert. Oriented to place and time. Recent and remote memory intact. Attention span, concentration and fund of knowledge appropriate. Mood and affect appropriate.  Cranial Nerves: Pupils equal, briskly reactive to light. Extraocular movements full without nystagmus. Visual fields full to confrontation. Hearing intact. Facial sensation intact. Face, tongue, palate moves normally and symmetrically.  Motor: Normal  bulk and tone. Normal strength in all tested extremity muscles Gait and Station: Arises from chair without difficulty. Stance is normal. Gait demonstrates antalgic gait without use of AD        ASSESSMENT/PLAN: Arthur King is a 73 y.o. year old male    OSA on CPAP :  Compliance report shows satisfactory usage with optimal residual AHI.   Continue current pressure settings 12-20 with EPR 2 Discussed continued nightly usage with ensuring greater than 4 hours nightly for optimal benefit and per insurance purposes.   DME  [facility], continue to follow with DME company for any needed supplies or CPAP related concerns CPAP set up 07/2023     Follow up in 1 year or call earlier if needed   CC:  PCP: Kennyth Worth HERO, MD    I personally spent a total of 25 minutes in the care of the patient today including preparing to see the patient, getting/reviewing separately obtained history, performing a medically appropriate exam/evaluation, counseling and educating, placing orders, and documenting clinical information in the EHR.  Harlene Bogaert, AGNP-BC  [facility] [address] [place], [place] [address]  Phone [phone] Fax [phone] Note: This document was prepared with digital dictation and possible smart phrase technology. Any transcriptional errors  that result from this process are unintentional.

## 2023-09-25 ENCOUNTER — Ambulatory Visit: Admitting: Adult Health

## 2023-09-25 ENCOUNTER — Encounter: Payer: Self-pay | Admitting: Adult Health

## 2023-09-25 VITALS — BP 132/62 | HR 68 | Ht 70.0 in | Wt 247.0 lb

## 2023-09-25 DIAGNOSIS — G4733 Obstructive sleep apnea (adult) (pediatric): Secondary | ICD-10-CM

## 2023-09-25 NOTE — Patient Instructions (Addendum)
 Your Plan:  Continue nightly use of CPAP with ensuring greater than 4 hours per night for optimal benefit  Continue to follow with your DME company [facility] for any needs supplies or CPAP related concerns      Follow-up in 1 year or call earlier if needed      Thank you for coming to see us  at [facility]. I hope we have been able to provide you high quality care today.  You may receive a patient satisfaction survey over the next few weeks. We would appreciate your feedback and comments so that we may continue to improve ourselves and the health of our patients.

## 2023-09-26 NOTE — Progress Notes (Signed)
 Arthur King D, CMA  Zott, [name], [name]; Darrel Boyer New orders have been placed for the above pt, DOB:[birth year] Thanks

## 2023-10-01 DIAGNOSIS — G4733 Obstructive sleep apnea (adult) (pediatric): Secondary | ICD-10-CM | POA: Diagnosis not present

## 2023-10-30 ENCOUNTER — Ambulatory Visit

## 2023-10-30 DIAGNOSIS — Z7901 Long term (current) use of anticoagulants: Secondary | ICD-10-CM

## 2023-10-30 LAB — POCT INR: INR: 2.4 (ref 2.0–3.0)

## 2023-10-30 NOTE — Patient Instructions (Addendum)
 Pre visit review using our clinic review tool, if applicable. No additional management support is needed unless otherwise documented below in the visit note.  Continue 1 tablet daily.  Re-check in 6 weeks.

## 2023-10-30 NOTE — Progress Notes (Addendum)
 Continue 1 tablet daily. Recheck in 6 weeks.  Medical screening examination/treatment/procedure(s) were performed by non-physician practitioner and as supervising physician I was immediately available for consultation/collaboration.  I agree with above. Karlynn Noel, MD

## 2023-11-01 ENCOUNTER — Telehealth: Payer: Self-pay | Admitting: Pharmacist

## 2023-11-01 NOTE — Progress Notes (Addendum)
 Pharmacy Quality Measure Review  This patient is appearing on a report for being at risk of failing the adherence measure for hypertension (ACEi/ARB) medications this calendar year.   Medication: lisinopril  HCT Last fill date: 07/05/205 for 90 day supply  Reviewed recent refill history in Epic.  Actual last refill date was 10/27/2023 for 90 day supply. Called his pharmacy and Rx for lisinopril -HCT has not been picked up yet. Patient has no refills remaining. Next appointment with PCP is 05/10/2023.    Left voicemail for patient reminding him that refill is ready for pick up at [facility] pharmacy along with this amlodipine  Rx.   Madelin Ray, PharmD Clinical Pharmacist [facility]  Population Health [phone]   11/06/2023 - Addendum Florence Napoleon - patient still has not picked up lisinopril  HCT Rx yet.  Will send MyChart message.   Madelin Ray, PharmD Clinical Pharmacist [facility]  Population Health [phone]

## 2023-12-11 ENCOUNTER — Ambulatory Visit

## 2023-12-14 ENCOUNTER — Ambulatory Visit (INDEPENDENT_AMBULATORY_CARE_PROVIDER_SITE_OTHER)

## 2023-12-14 DIAGNOSIS — Z7901 Long term (current) use of anticoagulants: Secondary | ICD-10-CM

## 2023-12-14 LAB — POCT INR: INR: 2.6 (ref 2.0–3.0)

## 2023-12-14 NOTE — Patient Instructions (Addendum)
 Pre visit review using our clinic review tool, if applicable. No additional management support is needed unless otherwise documented below in the visit note.  Continue 1 tablet daily. Recheck in 7 weeks due to holiday.

## 2023-12-14 NOTE — Progress Notes (Signed)
 Indication: Protein S deficiency Continue 1 tablet daily. Recheck in 7 weeks due to holiday.

## 2023-12-25 DIAGNOSIS — C61 Malignant neoplasm of prostate: Secondary | ICD-10-CM | POA: Diagnosis not present

## 2024-02-01 ENCOUNTER — Ambulatory Visit (INDEPENDENT_AMBULATORY_CARE_PROVIDER_SITE_OTHER)

## 2024-02-01 DIAGNOSIS — Z7901 Long term (current) use of anticoagulants: Secondary | ICD-10-CM | POA: Diagnosis not present

## 2024-02-01 LAB — POCT INR: INR: 2.1 (ref 2.0–3.0)

## 2024-02-01 MED ORDER — WARFARIN SODIUM 6 MG PO TABS: ORAL_TABLET | ORAL | 1 refills | Status: AC

## 2024-02-01 NOTE — Progress Notes (Signed)
 Indication: Protein S deficiency Continue 1 tablet daily. Recheck in 6 weeks.  Pt is compliant with warfarin management and PCP apts.  Sent in refill of warfarin to requested pharmacy.

## 2024-02-01 NOTE — Patient Instructions (Addendum)
 Pre visit review using our clinic review tool, if applicable. No additional management support is needed unless otherwise documented below in the visit note.  Continue 1 tablet daily.  Re-check in 6 weeks.

## 2024-03-14 ENCOUNTER — Ambulatory Visit

## 2024-05-09 ENCOUNTER — Encounter: Admitting: Family Medicine

## 2024-09-25 ENCOUNTER — Ambulatory Visit: Admitting: Adult Health
